# Patient Record
Sex: Male | Born: 1970 | Race: White | Hispanic: No | Smoking: Current every day smoker
Health system: Southern US, Community
[De-identification: ages and names within clinical notes are randomized; demographics above are authoritative.]

## PROBLEM LIST (undated history)

## (undated) DIAGNOSIS — T4145XA Adverse effect of unspecified anesthetic, initial encounter: Secondary | ICD-10-CM

## (undated) DIAGNOSIS — G8929 Other chronic pain: Secondary | ICD-10-CM

## (undated) DIAGNOSIS — R519 Headache, unspecified: Secondary | ICD-10-CM

## (undated) DIAGNOSIS — M549 Dorsalgia, unspecified: Secondary | ICD-10-CM

## (undated) DIAGNOSIS — R51 Headache: Secondary | ICD-10-CM

## (undated) DIAGNOSIS — T8859XA Other complications of anesthesia, initial encounter: Secondary | ICD-10-CM

## (undated) DIAGNOSIS — K219 Gastro-esophageal reflux disease without esophagitis: Secondary | ICD-10-CM

## (undated) HISTORY — PX: BACK SURGERY: SHX140

## (undated) HISTORY — PX: OTHER SURGICAL HISTORY: SHX169

---

## 2003-06-28 ENCOUNTER — Observation Stay (HOSPITAL_COMMUNITY): Admission: RE | Admit: 2003-06-28 | Discharge: 2003-06-29 | Payer: Self-pay | Admitting: Orthopedic Surgery

## 2013-08-03 ENCOUNTER — Encounter (HOSPITAL_BASED_OUTPATIENT_CLINIC_OR_DEPARTMENT_OTHER): Payer: Self-pay | Admitting: Emergency Medicine

## 2013-08-03 ENCOUNTER — Emergency Department (HOSPITAL_BASED_OUTPATIENT_CLINIC_OR_DEPARTMENT_OTHER)
Admission: EM | Admit: 2013-08-03 | Discharge: 2013-08-03 | Disposition: A | Discharge status: Home / Self Care | Payer: Self-pay | Attending: Emergency Medicine | Admitting: Emergency Medicine

## 2013-08-03 DIAGNOSIS — G8929 Other chronic pain: Secondary | ICD-10-CM | POA: Insufficient documentation

## 2013-08-03 DIAGNOSIS — Z79899 Other long term (current) drug therapy: Secondary | ICD-10-CM | POA: Insufficient documentation

## 2013-08-03 DIAGNOSIS — F17200 Nicotine dependence, unspecified, uncomplicated: Secondary | ICD-10-CM | POA: Insufficient documentation

## 2013-08-03 DIAGNOSIS — Z791 Long term (current) use of non-steroidal anti-inflammatories (NSAID): Secondary | ICD-10-CM | POA: Insufficient documentation

## 2013-08-03 DIAGNOSIS — K5289 Other specified noninfective gastroenteritis and colitis: Secondary | ICD-10-CM | POA: Insufficient documentation

## 2013-08-03 DIAGNOSIS — K529 Noninfective gastroenteritis and colitis, unspecified: Secondary | ICD-10-CM

## 2013-08-03 HISTORY — DX: Dorsalgia, unspecified: M54.9

## 2013-08-03 HISTORY — DX: Other chronic pain: G89.29

## 2013-08-03 MED ORDER — PROMETHAZINE HCL 25 MG PO TABS: 25.0000 mg | ORAL_TABLET | Freq: Four times a day (QID) | ORAL | Status: AC | PRN

## 2013-08-03 MED ORDER — NAPROXEN 500 MG PO TABS: 500.0000 mg | ORAL_TABLET | Freq: Two times a day (BID) | ORAL | Status: DC

## 2013-08-03 MED ORDER — NAPROXEN 250 MG PO TABS
500.0000 mg | ORAL_TABLET | Freq: Once | ORAL | Status: AC
  Administered 2013-08-03: 500 mg via ORAL
  Filled 2013-08-03: qty 2

## 2013-08-03 MED ORDER — ONDANSETRON 4 MG PO TBDP
4.0000 mg | ORAL_TABLET | Freq: Once | ORAL | Status: AC
  Administered 2013-08-03: 4 mg via ORAL
  Filled 2013-08-03: qty 1

## 2013-08-03 NOTE — ED Provider Notes (Signed)
CSN: [id number]     Arrival date & time 08/03/13  [id number] History   First MD Initiated Contact with Patient 08/03/13 [id number]     Chief Complaint  Patient presents with  . Emesis     (Consider location/radiation/quality/duration/timing/severity/associated sxs/prior Treatment) Patient is a 43 y.o. male presenting with vomiting. The history is provided by the patient.  Emesis Associated symptoms: diarrhea and myalgias   Associated symptoms: no abdominal pain and no headaches    patient with onset of nausea vomiting and diarrhea illness on Tuesday. Has had no diarrhea now for 2 days. No vomiting now for the last 24 hours. Nausea persists bodyaches persist. Patient has been out of work. No abnormal pain no fevers no severe headache.  Past Medical History  Diagnosis Date  . Chronic back pain    Past Surgical History  Procedure Laterality Date  . Back surgery     No family history on file. History  Substance Use Topics  . Smoking status: Current Every Day Smoker -- 1.00 packs/day    Types: Cigarettes  . Smokeless tobacco: Not on file  . Alcohol Use: No    Review of Systems  Constitutional: Positive for fatigue. Negative for fever.  HENT: Negative for congestion.   Eyes: Negative for redness.  Respiratory: Negative for cough and shortness of breath.   Cardiovascular: Negative for chest pain.  Gastrointestinal: Positive for nausea, vomiting and diarrhea. Negative for abdominal pain.  Genitourinary: Negative for hematuria.  Musculoskeletal: Positive for myalgias.  Skin: Negative for rash.  Neurological: Negative for headaches.  Hematological: Does not bruise/bleed easily.  Psychiatric/Behavioral: Negative for confusion.      Allergies  Review of patient's allergies indicates no known allergies.  Home Medications   Prior to Admission medications   Medication Sig Start Date End Date Taking? Authorizing Provider  celecoxib (CELEBREX) 200 MG capsule Take 200 mg by mouth 2 (two)  times daily.   Yes Historical Provider, MD  pregabalin (LYRICA) 75 MG capsule Take 75 mg by mouth 2 (two) times daily.   Yes Historical Provider, MD  naproxen (NAPROSYN) 500 MG tablet Take 1 tablet (500 mg total) by mouth 2 (two) times daily. 08/03/13   Shelda JakesScott W. Lilleigh Hechavarria, MD  promethazine (PHENERGAN) 25 MG tablet Take 1 tablet (25 mg total) by mouth every 6 (six) hours as needed for nausea or vomiting. 08/03/13   Shelda JakesScott W. Joua Bake, MD   BP 139/94  Pulse 82  Temp(Src) 97.5 F (36.4 C) (Oral)  Resp 14  SpO2 100% Physical Exam  Nursing note and vitals reviewed. Constitutional: He is oriented to person, place, and time. He appears well-developed and well-nourished. No distress.  HENT:  Head: Normocephalic and atraumatic.  Mouth/Throat: Oropharynx is clear and moist.  Eyes: Conjunctivae and EOM are normal. Pupils are equal, round, and reactive to light.  Neck: Normal range of motion.  Cardiovascular: Normal rate, regular rhythm and normal heart sounds.   Pulmonary/Chest: Effort normal and breath sounds normal. No respiratory distress.  Abdominal: Soft. Bowel sounds are normal. There is no tenderness.  Musculoskeletal: Normal range of motion. He exhibits no edema.  Neurological: He is alert and oriented to person, place, and time. No cranial nerve deficit. He exhibits normal muscle tone. Coordination normal.  Skin: Skin is warm. No rash noted.    ED Course  Procedures (including critical care time) Labs Review Labs Reviewed - No data to display  Imaging Review No results found.   EKG Interpretation None  MDM   Final diagnoses:  Gastroenteritis    The patient's history consistent with a gastroenteritis-type illness. No vomiting in the last several hours. No diarrhea for several days. Patient with persistent nausea. Will treat with Phenergan and Naprosyn Naprosyn as for the bodyaches. Patient nontoxic no acute distress. Work note provided.    Shelda JakesScott W. Ileta Ofarrell,  MD 08/03/13 [phone]

## 2013-08-03 NOTE — Discharge Instructions (Signed)
Diet for Diarrhea, Adult °Frequent, runny stools (diarrhea) may be caused or worsened by food or drink. Diarrhea may be relieved by changing your diet. Since diarrhea can last up to 7 days, it is easy for you to lose too much fluid from the body and become dehydrated. Fluids that are lost need to be replaced. Along with a modified diet, make sure you drink enough fluids to keep your urine clear or pale yellow. °DIET INSTRUCTIONS °· Ensure adequate fluid intake (hydration): have 1 cup (8 oz) of fluid for each diarrhea episode. Avoid fluids that contain simple sugars or sports drinks, fruit juices, whole milk products, and sodas. Your urine should be clear or pale yellow if you are drinking enough fluids. Hydrate with an oral rehydration solution that you can purchase at pharmacies, retail stores, and online. You can prepare an oral rehydration solution at home by mixing the following ingredients together: °·   tsp table salt. °· ¾ tsp baking soda. °·  tsp salt substitute containing potassium chloride. °· 1  tablespoons sugar. °· 1 L (34 oz) of water. °· Certain foods and beverages may increase the speed at which food moves through the gastrointestinal (GI) tract. These foods and beverages should be avoided and include: °· Caffeinated and alcoholic beverages. °· High-fiber foods, such as raw fruits and vegetables, nuts, seeds, and whole grain breads and cereals. °· Foods and beverages sweetened with sugar alcohols, such as xylitol, sorbitol, and mannitol. °· Some foods may be well tolerated and may help thicken stool including: °· Starchy foods, such as rice, toast, pasta, low-sugar cereal, oatmeal, grits, baked potatoes, crackers, and bagels.   °· Bananas.   °· Applesauce. °· Add probiotic-rich foods to help increase healthy bacteria in the GI tract, such as yogurt and fermented milk products. °RECOMMENDED FOODS AND BEVERAGES °Starches °Choose foods with less than 2 g of fiber per serving. °· Recommended:  Raimer,  French, and pita breads, plain rolls, buns, bagels. Plain muffins, matzo. Soda, saltine, or graham crackers. Pretzels, melba toast, zwieback. Cooked cereals made with water: cornmeal, farina, cream cereals. Dry cereals: refined corn, wheat, rice. Potatoes prepared any way without skins, refined macaroni, spaghetti, noodles, refined rice. °· Avoid:  Bread, rolls, or crackers made with whole wheat, multi-grains, rye, bran seeds, nuts, or coconut. Corn tortillas or taco shells. Cereals containing whole grains, multi-grains, bran, coconut, nuts, raisins. Cooked or dry oatmeal. Coarse wheat cereals, granola. Cereals advertised as "high-fiber." Potato skins. Whole grain pasta, wild or brown rice. Popcorn. Sweet potatoes, yams. Sweet rolls, doughnuts, waffles, pancakes, sweet breads. °Vegetables °· Recommended: Strained tomato and vegetable juices. Most well-cooked and canned vegetables without seeds. Fresh: Tender lettuce, cucumber without the skin, cabbage, spinach, bean sprouts. °· Avoid: Fresh, cooked, or canned: Artichokes, baked beans, beet greens, broccoli, Brussels sprouts, corn, kale, legumes, peas, sweet potatoes. Cooked: Green or red cabbage, spinach. Avoid large servings of any vegetables because vegetables shrink when cooked, and they contain more fiber per serving than fresh vegetables. °Fruit °· Recommended: Cooked or canned: Apricots, applesauce, cantaloupe, cherries, fruit cocktail, grapefruit, grapes, kiwi, mandarin oranges, peaches, pears, plums, watermelon. Fresh: Apples without skin, ripe banana, grapes, cantaloupe, cherries, grapefruit, peaches, oranges, plums. Keep servings limited to ½ cup or 1 piece. °· Avoid: Fresh: Apples with skin, apricots, mangoes, pears, raspberries, strawberries. Prune juice, stewed or dried prunes. Dried fruits, raisins, dates. Large servings of all fresh fruits. °Protein °· Recommended: Ground or well-cooked tender beef, ham, veal, lamb, pork, or poultry. Eggs. Fish,  oysters, shrimp,   lobster, other seafoods. Liver, organ meats.  Avoid: Tough, fibrous meats with gristle. Peanut butter, smooth or chunky. Cheese, nuts, seeds, legumes, dried peas, beans, lentils. Dairy  Recommended: Yogurt, lactose-free milk, kefir, drinkable yogurt, buttermilk, soy milk, or plain hard cheese.  Avoid: Milk, chocolate milk, beverages made with milk, such as milkshakes. Soups  Recommended: Bouillon, broth, or soups made from allowed foods. Any strained soup.  Avoid: Soups made from vegetables that are not allowed, cream or milk-based soups. Desserts and Sweets  Recommended: Sugar-free gelatin, sugar-free frozen ice pops made without sugar alcohol.  Avoid: Plain cakes and cookies, pie made with fruit, pudding, custard, cream pie. Gelatin, fruit, ice, sherbet, frozen ice pops. Ice cream, ice milk without nuts. Plain hard candy, honey, jelly, molasses, syrup, sugar, chocolate syrup, gumdrops, marshmallows. Fats and Oils  Recommended: Limit fats to less than 8 tsp per day.  Avoid: Seeds, nuts, olives, avocados. Margarine, butter, cream, mayonnaise, salad oils, plain salad dressings. Plain gravy, crisp bacon without rind. Beverages  Recommended: Water, decaffeinated teas, oral rehydration solutions, sugar-free beverages not sweetened with sugar alcohols.  Avoid: Fruit juices, caffeinated beverages (coffee, tea, soda), alcohol, sports drinks, or lemon-lime soda. Condiments  Recommended: Ketchup, mustard, horseradish, vinegar, cocoa powder. Spices in moderation: allspice, basil, bay leaves, celery powder or leaves, cinnamon, cumin powder, curry powder, ginger, mace, marjoram, onion or garlic powder, oregano, paprika, parsley flakes, ground pepper, rosemary, sage, savory, tarragon, thyme, turmeric.  Avoid: Coconut, honey. Document Released: 06/19/2003 Document Revised: 12/22/2011 Document Reviewed: 08/13/2011 [facility] Patient Information 2014 [employer], [place].  Take the  Phenergan as needed for the nausea any vomiting. Take the Naprosyn as needed for the bodyaches. Return for any newer worse symptoms. Work note provided.

## 2013-08-03 NOTE — ED Notes (Signed)
Pt has had n/v and diarrhea since Tuesday night. Pt took phenergan with some relief.  Pt continues to have body aches and nausea today.  No GU symtoms, no cough or cold.  Pt states that he feels tired and weak.  Pt has not vomited for 24 hours

## 2013-11-14 ENCOUNTER — Emergency Department (HOSPITAL_BASED_OUTPATIENT_CLINIC_OR_DEPARTMENT_OTHER): Payer: Self-pay

## 2013-11-14 ENCOUNTER — Other Ambulatory Visit: Payer: Self-pay

## 2013-11-14 ENCOUNTER — Encounter (HOSPITAL_BASED_OUTPATIENT_CLINIC_OR_DEPARTMENT_OTHER): Payer: Self-pay | Admitting: Emergency Medicine

## 2013-11-14 ENCOUNTER — Emergency Department (HOSPITAL_BASED_OUTPATIENT_CLINIC_OR_DEPARTMENT_OTHER)
Admission: EM | Admit: 2013-11-14 | Discharge: 2013-11-14 | Disposition: A | Discharge status: Home / Self Care | Payer: Self-pay | Attending: Emergency Medicine | Admitting: Emergency Medicine

## 2013-11-14 DIAGNOSIS — F17200 Nicotine dependence, unspecified, uncomplicated: Secondary | ICD-10-CM | POA: Insufficient documentation

## 2013-11-14 DIAGNOSIS — Z79899 Other long term (current) drug therapy: Secondary | ICD-10-CM | POA: Insufficient documentation

## 2013-11-14 DIAGNOSIS — R079 Chest pain, unspecified: Secondary | ICD-10-CM | POA: Insufficient documentation

## 2013-11-14 DIAGNOSIS — Z791 Long term (current) use of non-steroidal anti-inflammatories (NSAID): Secondary | ICD-10-CM | POA: Insufficient documentation

## 2013-11-14 DIAGNOSIS — R1013 Epigastric pain: Secondary | ICD-10-CM | POA: Insufficient documentation

## 2013-11-14 DIAGNOSIS — R112 Nausea with vomiting, unspecified: Secondary | ICD-10-CM | POA: Insufficient documentation

## 2013-11-14 DIAGNOSIS — G8929 Other chronic pain: Secondary | ICD-10-CM | POA: Insufficient documentation

## 2013-11-14 LAB — CBC WITH DIFFERENTIAL/PLATELET
Basophils Absolute: 0 10*3/uL (ref 0.0–0.1)
Basophils Relative: 0 % (ref 0–1)
EOS ABS: 0.1 10*3/uL (ref 0.0–0.7)
EOS PCT: 1 % (ref 0–5)
HEMATOCRIT: 45.5 % (ref 39.0–52.0)
Hemoglobin: 15.4 g/dL (ref 13.0–17.0)
LYMPHS ABS: 1.3 10*3/uL (ref 0.7–4.0)
LYMPHS PCT: 10 % — AB (ref 12–46)
MCH: 29.3 pg (ref 26.0–34.0)
MCHC: 33.8 g/dL (ref 30.0–36.0)
MCV: 86.7 fL (ref 78.0–100.0)
MONO ABS: 0.5 10*3/uL (ref 0.1–1.0)
Monocytes Relative: 4 % (ref 3–12)
Neutro Abs: 11.5 10*3/uL — ABNORMAL HIGH (ref 1.7–7.7)
Neutrophils Relative %: 86 % — ABNORMAL HIGH (ref 43–77)
Platelets: 249 10*3/uL (ref 150–400)
RBC: 5.25 MIL/uL (ref 4.22–5.81)
RDW: 14.1 % (ref 11.5–15.5)
WBC: 13.4 10*3/uL — AB (ref 4.0–10.5)

## 2013-11-14 LAB — COMPREHENSIVE METABOLIC PANEL
ALT: 19 U/L (ref 0–53)
ANION GAP: 12 (ref 5–15)
AST: 14 U/L (ref 0–37)
Albumin: 4.1 g/dL (ref 3.5–5.2)
Alkaline Phosphatase: 80 U/L (ref 39–117)
BUN: 11 mg/dL (ref 6–23)
CALCIUM: 10.3 mg/dL (ref 8.4–10.5)
CO2: 26 meq/L (ref 19–32)
CREATININE: 0.7 mg/dL (ref 0.50–1.35)
Chloride: 101 mEq/L (ref 96–112)
GLUCOSE: 136 mg/dL — AB (ref 70–99)
Potassium: 4.5 mEq/L (ref 3.7–5.3)
Sodium: 139 mEq/L (ref 137–147)
TOTAL PROTEIN: 7.2 g/dL (ref 6.0–8.3)
Total Bilirubin: 0.5 mg/dL (ref 0.3–1.2)

## 2013-11-14 LAB — LIPASE, BLOOD: LIPASE: 33 U/L (ref 11–59)

## 2013-11-14 LAB — TROPONIN I: Troponin I: <0.3 ng/mL (ref <0.30)

## 2013-11-14 MED ORDER — PANTOPRAZOLE SODIUM 40 MG IV SOLR
40.0000 mg | Freq: Once | INTRAVENOUS | Status: AC
  Administered 2013-11-14: 40 mg via INTRAVENOUS
  Filled 2013-11-14: qty 40

## 2013-11-14 MED ORDER — ESOMEPRAZOLE MAGNESIUM 40 MG PO CPDR: 40.0000 mg | DELAYED_RELEASE_CAPSULE | Freq: Every day | ORAL | Status: AC

## 2013-11-14 NOTE — ED Provider Notes (Addendum)
CSN: [id number]     Arrival date & time 11/14/13  1332 History   First MD Initiated Contact with Patient 11/14/13 1343     Chief Complaint  Patient presents with  . Chest Pain     (Consider location/radiation/quality/duration/timing/severity/associated sxs/prior Treatment) HPI Comments: Patient presents with chest pain. He describes it as achy pressure feeling in his upper abdomen is through to his back. He states it started about 2 hours ago it has been constant but improving since that time. He states he first started having some nausea early this morning had one episode of vomiting and then subsequently the pain started. He has a little bit of nausea currently. He denies any shortness of breath. He denies any pleuritic-type pain. He does say that he got diaphoretic when the pain was severe. He did not feel it's related to eating. He denies eating this morning. He denies any recent alcohol use. He denies a history of heart problems. He states his father had bypass surgery in his late 7250s or early 8160s. He does not have a known history of hypertension or hyperlipidemia but has not seen a doctor in a while. He is current everyday smoker.  Patient is a 43 y.o. male presenting with chest pain.  Chest Pain Associated symptoms: nausea and vomiting   Associated symptoms: no abdominal pain, no back pain, no cough, no diaphoresis, no dizziness, no fatigue, no fever, no headache, no numbness, no shortness of breath and no weakness     Past Medical History  Diagnosis Date  . Chronic back pain    Past Surgical History  Procedure Laterality Date  . Back surgery     History reviewed. No pertinent family history. History  Substance Use Topics  . Smoking status: Current Every Day Smoker -- 1.00 packs/day    Types: Cigarettes  . Smokeless tobacco: Not on file  . Alcohol Use: No    Review of Systems  Constitutional: Negative for fever, chills, diaphoresis and fatigue.  HENT: Negative for  congestion, rhinorrhea and sneezing.   Eyes: Negative.   Respiratory: Negative for cough, chest tightness and shortness of breath.   Cardiovascular: Positive for chest pain. Negative for leg swelling.  Gastrointestinal: Positive for nausea and vomiting. Negative for abdominal pain, diarrhea and blood in stool.  Genitourinary: Negative for frequency, hematuria, flank pain and difficulty urinating.  Musculoskeletal: Negative for arthralgias and back pain.  Skin: Negative for rash.  Neurological: Negative for dizziness, speech difficulty, weakness, numbness and headaches.      Allergies  Review of patient's allergies indicates no known allergies.  Home Medications   Prior to Admission medications   Medication Sig Start Date End Date Taking? Authorizing Provider  celecoxib (CELEBREX) 200 MG capsule Take 200 mg by mouth 2 (two) times daily.    Historical Provider, MD  esomeprazole (NEXIUM) 40 MG capsule Take 1 capsule (40 mg total) by mouth daily. 11/14/13   Rolan BuccoMelanie Gwendlyon Zumbro, MD  naproxen (NAPROSYN) 500 MG tablet Take 1 tablet (500 mg total) by mouth 2 (two) times daily. 08/03/13   Vanetta MuldersScott Zackowski, MD  pregabalin (LYRICA) 75 MG capsule Take 75 mg by mouth 2 (two) times daily.    Historical Provider, MD  promethazine (PHENERGAN) 25 MG tablet Take 1 tablet (25 mg total) by mouth every 6 (six) hours as needed for nausea or vomiting. 08/03/13   Vanetta MuldersScott Zackowski, MD   BP 125/64  Pulse 73  Temp(Src) 98.2 F (36.8 C) (Oral)  Resp 18  Ht 6'  3" (1.905 m)  Wt 198 lb (89.812 kg)  BMI 24.75 kg/m2  SpO2 96% Physical Exam  Constitutional: He is oriented to person, place, and time. He appears well-developed and well-nourished.  HENT:  Head: Normocephalic and atraumatic.  Eyes: Pupils are equal, round, and reactive to light.  Neck: Normal range of motion. Neck supple.  Cardiovascular: Normal rate, regular rhythm and normal heart sounds.   Pulmonary/Chest: Effort normal and breath sounds normal. No  respiratory distress. He has no wheezes. He has no rales. He exhibits no tenderness.  Abdominal: Soft. Bowel sounds are normal. There is tenderness (mild TTP epigastrium). There is no rebound and no guarding.  Musculoskeletal: Normal range of motion. He exhibits no edema.  Lymphadenopathy:    He has no cervical adenopathy.  Neurological: He is alert and oriented to person, place, and time.  Skin: Skin is warm and dry. No rash noted.  Psychiatric: He has a normal mood and affect.    ED Course  Procedures (including critical care time) Labs Review Results for orders placed during the hospital encounter of 11/14/13  TROPONIN I      Result Value Ref Range   Troponin I <0.30  <0.30 ng/mL  CBC WITH DIFFERENTIAL      Result Value Ref Range   WBC 13.4 (*) 4.0 - 10.5 K/uL   RBC 5.25  4.22 - 5.81 MIL/uL   Hemoglobin 15.4  13.0 - 17.0 g/dL   HCT 40.9  81.1 - 91.4 %   MCV 86.7  78.0 - 100.0 fL   MCH 29.3  26.0 - 34.0 pg   MCHC 33.8  30.0 - 36.0 g/dL   RDW 78.2  95.6 - 21.3 %   Platelets 249  150 - 400 K/uL   Neutrophils Relative % 86 (*) 43 - 77 %   Neutro Abs 11.5 (*) 1.7 - 7.7 K/uL   Lymphocytes Relative 10 (*) 12 - 46 %   Lymphs Abs 1.3  0.7 - 4.0 K/uL   Monocytes Relative 4  3 - 12 %   Monocytes Absolute 0.5  0.1 - 1.0 K/uL   Eosinophils Relative 1  0 - 5 %   Eosinophils Absolute 0.1  0.0 - 0.7 K/uL   Basophils Relative 0  0 - 1 %   Basophils Absolute 0.0  0.0 - 0.1 K/uL  COMPREHENSIVE METABOLIC PANEL      Result Value Ref Range   Sodium 139  137 - 147 mEq/L   Potassium 4.5  3.7 - 5.3 mEq/L   Chloride 101  96 - 112 mEq/L   CO2 26  19 - 32 mEq/L   Glucose, Bld 136 (*) 70 - 99 mg/dL   BUN 11  6 - 23 mg/dL   Creatinine, Ser 0.86  0.50 - 1.35 mg/dL   Calcium 57.8  8.4 - 46.9 mg/dL   Total Protein 7.2  6.0 - 8.3 g/dL   Albumin 4.1  3.5 - 5.2 g/dL   AST 14  0 - 37 U/L   ALT 19  0 - 53 U/L   Alkaline Phosphatase 80  39 - 117 U/L   Total Bilirubin 0.5  0.3 - 1.2 mg/dL   GFR calc  non Af Amer >90  >90 mL/min   GFR calc Af Amer >90  >90 mL/min   Anion gap 12  5 - 15  LIPASE, BLOOD      Result Value Ref Range   Lipase 33  11 - 59 U/L  Dg Chest 2 View  11/14/2013   CLINICAL DATA:  Chest pain.  EXAM: CHEST  2 VIEW  COMPARISON:  06/28/2003.  FINDINGS: Mediastinal and hilar structures are normal. The lungs are clear. Heart size normal. No pleural effusion or pneumothorax.  IMPRESSION: No acute cardiopulmonary disease.  Chest is stable from prior exam .   Electronically Signed   By: Maisie Fus  Register   On: 11/14/2013 14:03      Imaging Review Dg Chest 2 View  11/14/2013   CLINICAL DATA:  Chest pain.  EXAM: CHEST  2 VIEW  COMPARISON:  06/28/2003.  FINDINGS: Mediastinal and hilar structures are normal. The lungs are clear. Heart size normal. No pleural effusion or pneumothorax.  IMPRESSION: No acute cardiopulmonary disease.  Chest is stable from prior exam .   Electronically Signed   By: Maisie Fus  Register   On: 11/14/2013 14:03     EKG Interpretation   Date/Time:  Wednesday November 14 2013 13:42:59 EDT Ventricular Rate:  78 PR Interval:  168 QRS Duration: 86 QT Interval:  390 QTC Calculation: 444 R Axis:   74 Text Interpretation:  Normal sinus rhythm with sinus arrhythmia Normal ECG  No old tracing to compare Confirmed by Caryle Helgeson  MD, Maralyn Witherell ([id number]) on  11/14/2013 3:18:14 PM      MDM   Final diagnoses:  Chest pain, unspecified chest pain type    Patient is given Protonix in ED. He's pain free currently. His EKG did not show ischemic changes. His first troponin is negative. He has a heart score of 1. The plan is to check a second troponin at 5 PM today and if this is negative he can be discharged home with followup with his primary care physician who is with [facility]. Encouraged him to followup with his physician within the next 2 days or return here if his symptoms worsen.  Dr Manus Gunning will f/u on 2nd troponin and discharge as appropriate.  Repeat EKG   Date: 11/14/2013  Rate: 56  Rhythm: sinus bradycardia  QRS Axis: normal  Intervals: normal  ST/T Wave abnormalities: normal  Conduction Disutrbances:none  Narrative Interpretation:   Old EKG Reviewed: unchanged    Rolan Bucco, MD 11/14/13 1520  Rolan Bucco, MD 11/14/13 1540

## 2013-11-14 NOTE — ED Provider Notes (Signed)
Second troponin negative. Stable for discharge per plan established by Dr. Fredderick PhenixBelfi.  BP 125/64  Pulse 73  Temp(Src) 98.2 F (36.8 C) (Oral)  Resp 18  Ht 6\' 3"  (1.905 m)  Wt 198 lb (89.812 kg)  BMI 24.75 kg/m2  SpO2 96%   Aaron OctaveStephen Ladarrian Asencio, Aaron Livingston 11/14/13 [phone]

## 2013-11-14 NOTE — ED Notes (Signed)
Pt c/o med chest pain described as pressure which radiates to back x 2 hrs

## 2013-11-14 NOTE — Discharge Instructions (Signed)

## 2013-11-14 NOTE — ED Notes (Signed)
MD at bedside. 

## 2013-11-16 ENCOUNTER — Emergency Department (HOSPITAL_COMMUNITY): Payer: Self-pay

## 2013-11-16 ENCOUNTER — Emergency Department (HOSPITAL_COMMUNITY)
Admission: EM | Admit: 2013-11-16 | Discharge: 2013-11-17 | Disposition: A | Discharge status: Home / Self Care | Payer: Self-pay | Attending: Emergency Medicine | Admitting: Emergency Medicine

## 2013-11-16 ENCOUNTER — Encounter (HOSPITAL_COMMUNITY): Payer: Self-pay | Admitting: Emergency Medicine

## 2013-11-16 DIAGNOSIS — R1013 Epigastric pain: Secondary | ICD-10-CM | POA: Insufficient documentation

## 2013-11-16 DIAGNOSIS — G8929 Other chronic pain: Secondary | ICD-10-CM | POA: Insufficient documentation

## 2013-11-16 DIAGNOSIS — Z79899 Other long term (current) drug therapy: Secondary | ICD-10-CM | POA: Insufficient documentation

## 2013-11-16 DIAGNOSIS — Z791 Long term (current) use of non-steroidal anti-inflammatories (NSAID): Secondary | ICD-10-CM | POA: Insufficient documentation

## 2013-11-16 DIAGNOSIS — F17200 Nicotine dependence, unspecified, uncomplicated: Secondary | ICD-10-CM | POA: Insufficient documentation

## 2013-11-16 DIAGNOSIS — D72829 Elevated white blood cell count, unspecified: Secondary | ICD-10-CM | POA: Insufficient documentation

## 2013-11-16 LAB — COMPREHENSIVE METABOLIC PANEL
ALBUMIN: 4.7 g/dL (ref 3.5–5.2)
ALT: 21 U/L (ref 0–53)
AST: 20 U/L (ref 0–37)
Alkaline Phosphatase: 91 U/L (ref 39–117)
Anion gap: 20 — ABNORMAL HIGH (ref 5–15)
BILIRUBIN TOTAL: 1 mg/dL (ref 0.3–1.2)
BUN: 14 mg/dL (ref 6–23)
CHLORIDE: 93 meq/L — AB (ref 96–112)
CO2: 24 mEq/L (ref 19–32)
CREATININE: 0.7 mg/dL (ref 0.50–1.35)
Calcium: 10.5 mg/dL (ref 8.4–10.5)
GFR calc Af Amer: >90 mL/min (ref >90)
GFR calc non Af Amer: >90 mL/min (ref >90)
Glucose, Bld: 111 mg/dL — ABNORMAL HIGH (ref 70–99)
POTASSIUM: 3.8 meq/L (ref 3.7–5.3)
SODIUM: 137 meq/L (ref 137–147)
Total Protein: 8.1 g/dL (ref 6.0–8.3)

## 2013-11-16 LAB — CBC WITH DIFFERENTIAL/PLATELET
BASOS ABS: 0 10*3/uL (ref 0.0–0.1)
BASOS PCT: 0 % (ref 0–1)
Eosinophils Absolute: 0 10*3/uL (ref 0.0–0.7)
Eosinophils Relative: 0 % (ref 0–5)
HCT: 48.6 % (ref 39.0–52.0)
Hemoglobin: 17.1 g/dL — ABNORMAL HIGH (ref 13.0–17.0)
Lymphocytes Relative: 13 % (ref 12–46)
Lymphs Abs: 2.2 10*3/uL (ref 0.7–4.0)
MCH: 29.6 pg (ref 26.0–34.0)
MCHC: 35.2 g/dL (ref 30.0–36.0)
MCV: 84.1 fL (ref 78.0–100.0)
MONO ABS: 1.3 10*3/uL — AB (ref 0.1–1.0)
Monocytes Relative: 7 % (ref 3–12)
NEUTROS ABS: 13.9 10*3/uL — AB (ref 1.7–7.7)
NEUTROS PCT: 80 % — AB (ref 43–77)
PLATELETS: 299 10*3/uL (ref 150–400)
RBC: 5.78 MIL/uL (ref 4.22–5.81)
RDW: 13.8 % (ref 11.5–15.5)
WBC: 17.5 10*3/uL — ABNORMAL HIGH (ref 4.0–10.5)

## 2013-11-16 LAB — LIPASE, BLOOD: LIPASE: 26 U/L (ref 11–59)

## 2013-11-16 LAB — I-STAT TROPONIN, ED: Troponin i, poc: 0 ng/mL (ref 0.00–0.08)

## 2013-11-16 MED ORDER — ONDANSETRON HCL 4 MG/2ML IJ SOLN
4.0000 mg | Freq: Once | INTRAMUSCULAR | Status: AC
  Administered 2013-11-16: 4 mg via INTRAVENOUS
  Filled 2013-11-16: qty 2

## 2013-11-16 MED ORDER — METOCLOPRAMIDE HCL 5 MG/ML IJ SOLN
10.0000 mg | Freq: Once | INTRAMUSCULAR | Status: AC
  Administered 2013-11-16: 10 mg via INTRAVENOUS
  Filled 2013-11-16: qty 2

## 2013-11-16 MED ORDER — SODIUM CHLORIDE 0.9 % IV SOLN: 1000.0000 mL | INTRAVENOUS | Status: DC

## 2013-11-16 MED ORDER — IOHEXOL 300 MG/ML  SOLN
100.0000 mL | Freq: Once | INTRAMUSCULAR | Status: AC | PRN
Start: 2013-11-16 — End: 2013-11-16
  Administered 2013-11-16: 100 mL via INTRAVENOUS

## 2013-11-16 MED ORDER — SODIUM CHLORIDE 0.9 % IV SOLN
1000.0000 mL | Freq: Once | INTRAVENOUS | Status: AC
  Administered 2013-11-16: 1000 mL via INTRAVENOUS

## 2013-11-16 MED ORDER — HYDROMORPHONE HCL PF 1 MG/ML IJ SOLN
1.0000 mg | Freq: Once | INTRAMUSCULAR | Status: AC
  Administered 2013-11-16: 1 mg via INTRAVENOUS
  Filled 2013-11-16: qty 1

## 2013-11-16 MED ORDER — ACETAMINOPHEN 325 MG PO TABS
650.0000 mg | ORAL_TABLET | Freq: Four times a day (QID) | ORAL | Status: DC | PRN
  Administered 2013-11-16: 650 mg via ORAL
  Filled 2013-11-16: qty 2

## 2013-11-16 NOTE — ED Notes (Signed)
Pt presents with mid upper abd pain radiating to RUQ region,m nausea and vomiting starting  on Wednesday. Pt seen at [facility] and diagnosed with Acid Reflux pt followed up with his PCP on Thursday and prescribed phenergan with no relief. Pt also reports difficulty urinating due to not able to keep food or fluids down.

## 2013-11-16 NOTE — ED Provider Notes (Signed)
CSN: [id number]     Arrival date & time 11/16/13  1618 History   First MD Initiated Contact with Patient 11/16/13 1756     Chief Complaint  Patient presents with  . Abdominal Pain      HPI Patient presents emergency department with ongoing right upper quadrant and epigastric abdominal pain over the past 3 days.  He was seen at med center [facility] and it was thought to be gastritis.  He was discharged with pain medicine.  He states he continues to have discomfort and pain.  He reports pain seems to be worsening.  Phenergan does not improve his nausea.  His had decreased oral intake.  He presents the emergency department with a fever of 101.  His pain is moderate in severity   Past Medical History  Diagnosis Date  . Chronic back pain    Past Surgical History  Procedure Laterality Date  . Back surgery     History reviewed. No pertinent family history. History  Substance Use Topics  . Smoking status: Current Every Day Smoker -- 1.00 packs/day    Types: Cigarettes  . Smokeless tobacco: Not on file  . Alcohol Use: No    Review of Systems  All other systems reviewed and are negative.     Allergies  Review of patient's allergies indicates no known allergies.  Home Medications   Prior to Admission medications   Medication Sig Start Date End Date Taking? Authorizing Provider  celecoxib (CELEBREX) 200 MG capsule Take 200 mg by mouth 2 (two) times daily.    Historical Provider, MD  esomeprazole (NEXIUM) 40 MG capsule Take 1 capsule (40 mg total) by mouth daily. 11/14/13   Rolan Bucco, MD  naproxen (NAPROSYN) 500 MG tablet Take 1 tablet (500 mg total) by mouth 2 (two) times daily. 08/03/13   Vanetta Mulders, MD  pregabalin (LYRICA) 75 MG capsule Take 75 mg by mouth 2 (two) times daily.    Historical Provider, MD  promethazine (PHENERGAN) 25 MG tablet Take 1 tablet (25 mg total) by mouth every 6 (six) hours as needed for nausea or vomiting. 08/03/13   Vanetta Mulders, MD   BP 123/70   Pulse 56  Temp(Src) 101.1 F (38.4 C) (Rectal)  Resp 20  SpO2 100% Physical Exam  Nursing note and vitals reviewed. Constitutional: He is oriented to person, place, and time. He appears well-developed and well-nourished.  HENT:  Head: Normocephalic and atraumatic.  Eyes: EOM are normal.  Neck: Normal range of motion.  Cardiovascular: Normal rate, regular rhythm, normal heart sounds and intact distal pulses.   Pulmonary/Chest: Effort normal and breath sounds normal. No respiratory distress.  Abdominal: Soft. He exhibits no distension.  Epigastric tenderness without guarding or rebound  Musculoskeletal: Normal range of motion.  Neurological: He is alert and oriented to person, place, and time.  Skin: Skin is warm and dry.  Psychiatric: He has a normal mood and affect. Judgment normal.    ED Course  Procedures (including critical care time) Labs Review Labs Reviewed  CBC WITH DIFFERENTIAL - Abnormal; Notable for the following:    WBC 17.5 (*)    Hemoglobin 17.1 (*)    Neutrophils Relative % 80 (*)    Neutro Abs 13.9 (*)    Monocytes Absolute 1.3 (*)    All other components within normal limits  COMPREHENSIVE METABOLIC PANEL - Abnormal; Notable for the following:    Chloride 93 (*)    Glucose, Bld 111 (*)    Anion  gap 20 (*)    All other components within normal limits  LIPASE, BLOOD  I-STAT TROPOININ, ED    Imaging Review No results found.   EKG Interpretation   Date/Time:  Friday November 16 2013 16:55:05 EDT Ventricular Rate:  81 PR Interval:  144 QRS Duration: 86 QT Interval:  362 QTC Calculation: 420 R Axis:   80 Text Interpretation:  Normal sinus rhythm Normal ECG No significant change  was found Confirmed by Jilian West  MD, Annalese Stiner ([id number]) on 11/16/2013 5:58:25 PM      MDM   Final diagnoses:  None    Concerning for cholecystitis.  Ultrasound pending.  White count 17,000.  His gallbladder demonstrates no signs of infection the patient will need a CT scan of  his abdomen and pelvis.  N.p.o. Imaging to be followed up by my NP Volney AmericanGail Shulz    Ethal Gotay M Rodgers Likes, MD 11/17/13 [phone]

## 2013-11-17 MED ORDER — SUCRALFATE 1 G PO TABS
1.0000 g | ORAL_TABLET | Freq: Once | ORAL | Status: AC
  Administered 2013-11-17: 1 g via ORAL
  Filled 2013-11-17: qty 1

## 2013-11-17 MED ORDER — SUCRALFATE 1 GM/10ML PO SUSP: 1.0000 g | Freq: Three times a day (TID) | ORAL | Status: DC

## 2013-11-17 NOTE — ED Notes (Signed)
Discharge instructions reviewed with pt. Pt verbalized understanding.   

## 2013-11-17 NOTE — Discharge Instructions (Signed)
Leukocytosis °Leukocytosis means you have more white blood cells than normal. White blood cells are made in your bone marrow. The main job of white blood cells is to fight infection. Having too many white blood cells is a common condition. It can develop as a result of many types of medical problems. °CAUSES  °In some cases, your bone marrow may be normal, but it is still making too many white blood cells. This could be the result of: °· Infection. °· Injury. °· Physical stress. °· Emotional stress. °· Surgery. °· Allergic reactions. °· Tumors that do not start in the blood or bone marrow. °· An inherited disease. °· Certain medicines. °· Pregnancy and labor. °In other cases, you may have a bone marrow disorder that is causing your body to make too many white blood cells. Bone marrow disorders include: °· Leukemia. This is a type of blood cancer. °· Myeloproliferative disorders. These disorders cause blood cells to grow abnormally. °SYMPTOMS  °Some people have no symptoms. Others have symptoms due to the medical problem that is causing their leukocytosis. These symptoms may include: °· Bleeding. °· Bruising. °· Fever. °· Night sweats. °· Repeated infections. °· Weakness. °· Weight loss. °DIAGNOSIS  °Leukocytosis is often found during blood tests that are done as part of a normal physical exam. Your caregiver will probably order other tests to help determine why you have too many white blood cells. These tests may include: °· A complete blood count (CBC). This test measures all the types of blood cells in your body. °· Chest X-rays, urine tests (urinalysis), or other tests to look for signs of infection. °· Bone marrow aspiration. For this test, a needle is put into your bone. Cells from the bone marrow are removed through the needle. The cells are then examined under a microscope. °TREATMENT  °Treatment is usually not needed for leukocytosis. However, if a disorder is causing your leukocytosis, it will need to be  treated. Treatment may include: °· Antibiotic medicines if you have a bacterial infection. °· Bone marrow transplant. Your diseased bone marrow is replaced with healthy cells that will grow new bone marrow. °· Chemotherapy. This is the use of drugs to kill cancer cells. °HOME CARE INSTRUCTIONS °· Only take over-the-counter or prescription medicines as directed by your caregiver. °· Maintain a healthy weight. Ask your caregiver what weight is best for you. °· Eat foods that are low in saturated fats and high in fiber. Eat plenty of fruits and vegetables. °· Drink enough fluids to keep your urine clear or pale yellow. °· Get 30 minutes of exercise at least 5 times a week. Check with your caregiver before starting a new exercise routine. °· Limit caffeine and alcohol. °· Do not smoke. °· Keep all follow-up appointments as directed by your caregiver. °SEEK MEDICAL CARE IF: °· You feel weak or more tired than usual. °· You develop chills, a cough, or nasal congestion. °· You lose weight without trying. °· You have night sweats. °· You bruise easily. °SEEK IMMEDIATE MEDICAL CARE IF: °· You bleed more than normal. °· You have chest pain. °· You have trouble breathing. °· You have a fever. °· You have uncontrolled nausea or vomiting. °· You feel dizzy or lightheaded. °MAKE SURE YOU: °· Understand these instructions. °· Will watch your condition. °· Will get help right away if you are not doing well or get worse. °Document Released: 03/18/2011 Document Revised: 06/21/2011 Document Reviewed: 03/18/2011 °[employer]® Patient Information ©2015 [employer]. This information is   not intended to replace advice given to you by your health care provider. Make sure you discuss any questions you have with your health care provider. Tonight your ultrasound and CT Scan are normal You are being referred to a gastroenterologist for further evaluation  You have been given a prescriptions for additional medication to take until the Nexium  has had a chance to become effective

## 2017-03-18 ENCOUNTER — Ambulatory Visit: Payer: Self-pay | Admitting: Orthopedic Surgery

## 2017-03-25 ENCOUNTER — Ambulatory Visit: Payer: Self-pay | Admitting: Orthopedic Surgery

## 2017-03-25 NOTE — H&P (View-Only) (Signed)
Aaron Livingston is an 46 y.o. male.   Chief Complaint: back and left leg pain HPI: The patient is a 46 year old male who presents today for follow up of their back. The patient is being followed for their low back pain and back symptoms. They are now 11 week(s) out from injury (DOI 12/09/2016). Symptoms reported today include: pain. Current treatment includes: activity modification. The following medication has been used for pain control: none. The patient presents today following ESI L4-5 x 15 days. The patient reports the injection did not help much. Note for "Follow-up back": The patient currently has light duty restrictions of no lifting over 10 lbs, bending, stooping, squatting and no prolonged standing or sitting.  Note:Patient reports injection gave him minimal relief. He is 11 weeks status post his injury is pain that radiates down into the left leg now specifically not the right. 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  Past Medical History:  Diagnosis Date  . Chronic back pain     Past Surgical History:  Procedure Laterality Date  . BACK SURGERY      No family history on file. Social History:  reports that he has been smoking cigarettes.  He has been smoking about 1.00 pack per day. He does not have any smokeless tobacco history on file. He reports that he does not drink alcohol or use drugs.  Allergies:  Allergies  Allergen Reactions  . Ibuprofen Other (See Comments)    Upset stomach, shaking, tremors, magnifies headache     (Not in a hospital admission)  No results found for this or any previous visit (from the past 48 hour(s)). No results found.  Review of Systems  Constitutional: Negative.   HENT: Negative.   Eyes: Negative.   Respiratory: Negative.   Cardiovascular: Negative.   Gastrointestinal: Negative.   Genitourinary: Negative.   Musculoskeletal: Positive for back pain.  Skin: Negative.   Neurological: Positive for  sensory change and focal weakness.  Psychiatric/Behavioral: Negative.     There were no vitals taken for this visit. Physical Exam  Constitutional: He is oriented to person, place, and time. He appears well-developed.  HENT:  Head: Normocephalic.  Eyes: Pupils are equal, round, and reactive to light.  Neck: Normal range of motion.  Cardiovascular: Normal rate.  Respiratory: Effort normal.  GI: Soft.  Musculoskeletal:  Moderate distress. Walks an antalgic gait. Mood and affect is appropriate is tender left proximal gluteus. Straight leg raise buttock that calf pain on the left negative on the right. EHLs 4+/5 left compared to the right. Sensory exam is intact. No Babinski or clonus. No instability hips knees and ankles. Pulses intact. Pelvis stable. Abdomen soft nontender. Upper extremities motor is 5/5. Normoreflexic. Sensory exam is intact. No Hoffmann sign.   Neurological: He is alert and oriented to person, place, and time.  Skin: Skin is warm and dry.    MRI demonstrates paracentral disc herniation at 4 5 to the left deflecting the left L5 nerve root. There is underlying disc degeneration as well. MRI clearly shows that on the series 4 image 40/64 on the T2 sagittal image as well as the axial T2 image series 5 image 10 out of 18.  Assessment/Plan This point time the patient has a received only temporary relief from the epidural steroid injection. He has a radicular pain into the left leg the L5 nerve root distribution. He is neurotension signs and EHL weakness.  He continues  to remain out of work as there is no light duty available. He reports he is unable to do so due to his left leg pain. He has minimal back pain.  2 options at this point time 1 conservative I would consider up maximum medical improvement in a radiant release versus consideration of a microlumbar decompression at L4-5 on the left. He does not want to live with his symptoms. 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  I had an extensive discussion of the risks and benefits of the lumbar decompression with the patient including bleeding, infection, damage to neurovascular structures, epidural fibrosis, CSF leak requiring repair. We also discussed increase in pain, adjacent segment disease, recurrent disc herniation, need for future surgery including repeat decompression and/or fusion. We also discussed risks of postoperative hematoma, paralysis, anesthetic complications including DVT, PE, death, cardiopulmonary dysfunction. In addition, the perioperative and postoperative courses were discussed in detail including the rehabilitative time and return to functional activity and work. I provided the patient with an illustrated handout and utilized the appropriate surgical models.  We discussed the risks and benefits including bleeding, infection damage to neurovascular structures no changes in his symptoms worsen in symptoms DVT PE anesthetic complications etc.  Overnight in the hospital  Sutures out in 2 weeks  Light duty available at 6 weeks and max medical improvement 3 months postoperatively. Is instructed to quit smoking advised him against that indicating the deleterious side effects upon healing and scar tissue.Continue light duty restriction in the interim no lifting over 10 pounds or repetitive bending stooping and squatting.  I do feel this is related to his initial injury.   BISSELL, JACLYN M., PA-C  For Dr. Beane 03/25/2017, 11:34 AM   

## 2017-03-25 NOTE — H&P (Signed)
Aaron Livingston is an 46 y.o. male.   Chief Complaint: back and left leg pain HPI: The patient is a 46 year old male who presents today for follow up of their back. The patient is being followed for their low back pain and back symptoms. They are now 11 week(s) out from injury (DOI 12/09/2016). Symptoms reported today include: pain. Current treatment includes: activity modification. The following medication has been used for pain control: none. The patient presents today following ESI L4-5 x 15 days. The patient reports the injection did not help much. Note for "Follow-up back": The patient currently has light duty restrictions of no lifting over 10 lbs, bending, stooping, squatting and no prolonged standing or sitting.  Note:Patient reports injection gave him minimal relief. He is 11 weeks status post his injury is pain that radiates down into the left leg now specifically not the right. 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  Past Medical History:  Diagnosis Date  . Chronic back pain     Past Surgical History:  Procedure Laterality Date  . BACK SURGERY      No family history on file. Social History:  reports that he has been smoking cigarettes.  He has been smoking about 1.00 pack per day. He does not have any smokeless tobacco history on file. He reports that he does not drink alcohol or use drugs.  Allergies:  Allergies  Allergen Reactions  . Ibuprofen Other (See Comments)    Upset stomach, shaking, tremors, magnifies headache     (Not in a hospital admission)  No results found for this or any previous visit (from the past 48 hour(s)). No results found.  Review of Systems  Constitutional: Negative.   HENT: Negative.   Eyes: Negative.   Respiratory: Negative.   Cardiovascular: Negative.   Gastrointestinal: Negative.   Genitourinary: Negative.   Musculoskeletal: Positive for back pain.  Skin: Negative.   Neurological: Positive for  sensory change and focal weakness.  Psychiatric/Behavioral: Negative.     There were no vitals taken for this visit. Physical Exam  Constitutional: He is oriented to person, place, and time. He appears well-developed.  HENT:  Head: Normocephalic.  Eyes: Pupils are equal, round, and reactive to light.  Neck: Normal range of motion.  Cardiovascular: Normal rate.  Respiratory: Effort normal.  GI: Soft.  Musculoskeletal:  Moderate distress. Walks an antalgic gait. Mood and affect is appropriate is tender left proximal gluteus. Straight leg raise buttock that calf pain on the left negative on the right. EHLs 4+/5 left compared to the right. Sensory exam is intact. No Babinski or clonus. No instability hips knees and ankles. Pulses intact. Pelvis stable. Abdomen soft nontender. Upper extremities motor is 5/5. Normoreflexic. Sensory exam is intact. No Hoffmann sign.   Neurological: He is alert and oriented to person, place, and time.  Skin: Skin is warm and dry.    MRI demonstrates paracentral disc herniation at 4 5 to the left deflecting the left L5 nerve root. There is underlying disc degeneration as well. MRI clearly shows that on the series 4 image 40/64 on the T2 sagittal image as well as the axial T2 image series 5 image 10 out of 18.  Assessment/Plan This point time the patient has a received only temporary relief from the epidural steroid injection. He has a radicular pain into the left leg the L5 nerve root distribution. He is neurotension signs and EHL weakness.  He continues  to remain out of work as there is no light duty available. He reports he is unable to do so due to his left leg pain. He has minimal back pain.  2 options at this point time 1 conservative I would consider up maximum medical improvement in a radiant release versus consideration of a microlumbar decompression at L4-5 on the left. He does not want to live with his symptoms. 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  I had an extensive discussion of the risks and benefits of the lumbar decompression with the patient including bleeding, infection, damage to neurovascular structures, epidural fibrosis, CSF leak requiring repair. We also discussed increase in pain, adjacent segment disease, recurrent disc herniation, need for future surgery including repeat decompression and/or fusion. We also discussed risks of postoperative hematoma, paralysis, anesthetic complications including DVT, PE, death, cardiopulmonary dysfunction. In addition, the perioperative and postoperative courses were discussed in detail including the rehabilitative time and return to functional activity and work. I provided the patient with an illustrated handout and utilized the appropriate surgical models.  We discussed the risks and benefits including bleeding, infection damage to neurovascular structures no changes in his symptoms worsen in symptoms DVT PE anesthetic complications etc.  Overnight in the hospital  Sutures out in 2 weeks  Light duty available at 6 weeks and max medical improvement 3 months postoperatively. Is instructed to quit smoking advised him against that indicating the deleterious side effects upon healing and scar tissue.Continue light duty restriction in the interim no lifting over 10 pounds or repetitive bending stooping and squatting.  I do feel this is related to his initial injury.   Dorothy SparkBISSELL, JACLYN M., PA-C  For Dr. Shelle IronBeane 03/25/2017, 11:34 AM

## 2017-03-30 NOTE — Patient Instructions (Signed)
Aaron Livingston  03/30/2017   Your procedure is scheduled on: 04-06-17  [facility]  Entrance [place]  elevators to 3rd floor to  Short Stay Center at    0530 AM.    Call this number if you have problems the morning of surgery [phone]    Remember: ONLY 1 PERSON MAY GO WITH YOU TO SHORT STAY TO GET  READY MORNING OF YOUR SURGERY.  Do not eat food or drink liquids :After Midnight.     Take these medicines the morning of surgery with A SIP OF WATER: none                                You may not have any metal on your body including hair pins and              piercings  Do not wear jewelry,  lotions, powders or perfumes, deodorant                     Men may shave face and neck.   Do not bring valuables to the hospital. [facility] IS NOT             RESPONSIBLE   FOR VALUABLES.  Contacts, dentures or bridgework may not be worn into surgery.  Leave suitcase in the car. After surgery it may be brought to your room.               Please read over the following fact sheets you were given: _____________________________________________________________________           [facility] - Preparing for [facility] Before surgery, you can play an important role.  Because skin is not sterile, your skin needs to be as free of germs as possible.  You can reduce the number of germs on your skin by washing with CHG (chlorahexidine gluconate) soap before surgery.  CHG is an antiseptic cleaner which kills germs and bonds with the skin to continue killing germs even after washing. Please DO NOT use if you have an allergy to CHG or antibacterial soaps.  If your skin becomes reddened/irritated stop using the CHG and inform your nurse when you arrive at Short Stay. Do not shave (including legs and underarms) for at least 48 hours prior to the first CHG shower.  You may shave your face/neck. Please follow these instructions carefully:  1.  Shower with CHG Soap the  night before surgery and the  morning of Surgery.  2.  If you choose to wash your hair, wash your hair first as usual with your  normal  shampoo.  3.  After you shampoo, rinse your hair and body thoroughly to remove the  shampoo.                           4.  Use CHG as you would any other liquid soap.  You can apply chg directly  to the skin and wash                       Gently with a scrungie or clean washcloth.  5.  Apply the CHG Soap to your body ONLY FROM THE NECK DOWN.   Do not use on face/ open  Wound or open sores. Avoid contact with eyes, ears mouth and genitals (private parts).                       Wash face,  Genitals (private parts) with your normal soap.             6.  Wash thoroughly, paying special attention to the area where your surgery  will be performed.  7.  Thoroughly rinse your body with warm water from the neck down.  8.  DO NOT shower/wash with your normal soap after using and rinsing off  the CHG Soap.                9.  Pat yourself dry with a clean towel.            10.  Wear clean pajamas.            11.  Place clean sheets on your bed the night of your first shower and do not  sleep with pets. Day of Surgery : Do not apply any lotions/deodorants the morning of surgery.  Please wear clean clothes to the hospital/surgery center.  FAILURE TO FOLLOW THESE INSTRUCTIONS MAY RESULT IN THE CANCELLATION OF YOUR SURGERY PATIENT SIGNATURE_________________________________  NURSE SIGNATURE__________________________________  ________________________________________________________________________   Aaron Livingston  An incentive spirometer is a tool that can help keep your lungs clear and active. This tool measures how well you are filling your lungs with each breath. Taking long deep breaths may help reverse or decrease the chance of developing breathing (pulmonary) problems (especially infection) following:  A long period of time when you  are unable to move or be active. BEFORE THE PROCEDURE   If the spirometer includes an indicator to show your best effort, your nurse or respiratory therapist will set it to a desired goal.  If possible, sit up straight or lean slightly forward. Try not to slouch.  Hold the incentive spirometer in an upright position. INSTRUCTIONS FOR USE  1. Sit on the edge of your bed if possible, or sit up as far as you can in bed or on a chair. 2. Hold the incentive spirometer in an upright position. 3. Breathe out normally. 4. Place the mouthpiece in your mouth and seal your lips tightly around it. 5. Breathe in slowly and as deeply as possible, raising the piston or the ball toward the top of the column. 6. Hold your breath for 3-5 seconds or for as long as possible. Allow the piston or ball to fall to the bottom of the column. 7. Remove the mouthpiece from your mouth and breathe out normally. 8. Rest for a few seconds and repeat Steps 1 through 7 at least 10 times every 1-2 hours when you are awake. Take your time and take a few normal breaths between deep breaths. 9. The spirometer may include an indicator to show your best effort. Use the indicator as a goal to work toward during each repetition. 10. After each set of 10 deep breaths, practice coughing to be sure your lungs are clear. If you have an incision (the cut made at the time of surgery), support your incision when coughing by placing a pillow or rolled up towels firmly against it. Once you are able to get out of bed, walk around indoors and cough well. You may stop using the incentive spirometer when instructed by your caregiver.  RISKS AND COMPLICATIONS  Take your time so you do not get  dizzy or light-headed.  If you are in pain, you may need to take or ask for pain medication before doing incentive spirometry. It is harder to take a deep breath if you are having pain. AFTER USE  Rest and breathe slowly and easily.  It can be helpful to  keep track of a log of your progress. Your caregiver can provide you with a simple table to help with this. If you are using the spirometer at home, follow these instructions: [place] IF:   You are having difficultly using the spirometer.  You have trouble using the spirometer as often as instructed.  Your pain medication is not giving enough relief while using the spirometer.  You develop fever of 100.5 F (38.1 C) or higher. SEEK IMMEDIATE MEDICAL CARE IF:   You cough up bloody sputum that had not been present before.  You develop fever of 102 F (38.9 C) or greater.  You develop worsening pain at or near the incision site. MAKE SURE YOU:   Understand these instructions.  Will watch your condition.  Will get help right away if you are not doing well or get worse. Document Released: 08/09/2006 Document Revised: 06/21/2011 Document Reviewed: 10/10/2006 [facility] Patient Information 2014 [place], [place].   ________________________________________________________________________

## 2017-03-31 ENCOUNTER — Encounter (INDEPENDENT_AMBULATORY_CARE_PROVIDER_SITE_OTHER): Payer: Self-pay

## 2017-03-31 ENCOUNTER — Encounter (HOSPITAL_COMMUNITY): Payer: Self-pay

## 2017-03-31 ENCOUNTER — Ambulatory Visit (HOSPITAL_COMMUNITY)
Admission: RE | Admit: 2017-03-31 | Discharge: 2017-03-31 | Disposition: A | Discharge status: Home / Self Care | Payer: Worker's Compensation | Source: Ambulatory Visit | Attending: Orthopedic Surgery | Admitting: Orthopedic Surgery

## 2017-03-31 ENCOUNTER — Encounter (HOSPITAL_COMMUNITY)
Admission: RE | Admit: 2017-03-31 | Discharge: 2017-03-31 | Disposition: A | Discharge status: Home / Self Care | Payer: Worker's Compensation | Source: Ambulatory Visit | Attending: Specialist | Admitting: Specialist

## 2017-03-31 ENCOUNTER — Other Ambulatory Visit: Payer: Self-pay

## 2017-03-31 DIAGNOSIS — I700 Atherosclerosis of aorta: Secondary | ICD-10-CM | POA: Diagnosis not present

## 2017-03-31 DIAGNOSIS — Z01812 Encounter for preprocedural laboratory examination: Secondary | ICD-10-CM | POA: Insufficient documentation

## 2017-03-31 DIAGNOSIS — M5126 Other intervertebral disc displacement, lumbar region: Secondary | ICD-10-CM | POA: Insufficient documentation

## 2017-03-31 DIAGNOSIS — M5137 Other intervertebral disc degeneration, lumbosacral region: Secondary | ICD-10-CM | POA: Diagnosis not present

## 2017-03-31 HISTORY — DX: Headache, unspecified: R51.9

## 2017-03-31 HISTORY — DX: Headache: R51

## 2017-03-31 HISTORY — DX: Adverse effect of unspecified anesthetic, initial encounter: T41.45XA

## 2017-03-31 HISTORY — DX: Gastro-esophageal reflux disease without esophagitis: K21.9

## 2017-03-31 HISTORY — DX: Other complications of anesthesia, initial encounter: T88.59XA

## 2017-03-31 LAB — BASIC METABOLIC PANEL
ANION GAP: 6 (ref 5–15)
BUN: 11 mg/dL (ref 6–20)
CALCIUM: 9.3 mg/dL (ref 8.9–10.3)
CO2: 25 mmol/L (ref 22–32)
Chloride: 106 mmol/L (ref 101–111)
Creatinine, Ser: 0.76 mg/dL (ref 0.61–1.24)
Glucose, Bld: 101 mg/dL — ABNORMAL HIGH (ref 65–99)
Potassium: 4.5 mmol/L (ref 3.5–5.1)
SODIUM: 137 mmol/L (ref 135–145)

## 2017-03-31 LAB — SURGICAL PCR SCREEN
MRSA, PCR: NEGATIVE
STAPHYLOCOCCUS AUREUS: NEGATIVE

## 2017-03-31 LAB — CBC
HCT: 45.6 % (ref 39.0–52.0)
HEMOGLOBIN: 15.3 g/dL (ref 13.0–17.0)
MCH: 29 pg (ref 26.0–34.0)
MCHC: 33.6 g/dL (ref 30.0–36.0)
MCV: 86.4 fL (ref 78.0–100.0)
Platelets: 258 10*3/uL (ref 150–400)
RBC: 5.28 MIL/uL (ref 4.22–5.81)
RDW: 13.3 % (ref 11.5–15.5)
WBC: 10 10*3/uL (ref 4.0–10.5)

## 2017-04-06 ENCOUNTER — Encounter (HOSPITAL_COMMUNITY)
Admission: RE | Disposition: A | Discharge status: Home / Self Care | Payer: Self-pay | Source: Ambulatory Visit | Attending: Specialist

## 2017-04-06 ENCOUNTER — Ambulatory Visit (HOSPITAL_COMMUNITY): Payer: Worker's Compensation

## 2017-04-06 ENCOUNTER — Ambulatory Visit (HOSPITAL_COMMUNITY): Payer: Worker's Compensation | Admitting: Anesthesiology

## 2017-04-06 ENCOUNTER — Encounter (HOSPITAL_COMMUNITY): Payer: Self-pay | Admitting: Emergency Medicine

## 2017-04-06 ENCOUNTER — Other Ambulatory Visit: Payer: Self-pay

## 2017-04-06 ENCOUNTER — Ambulatory Visit (HOSPITAL_COMMUNITY)
Admission: RE | Admit: 2017-04-06 | Discharge: 2017-04-07 | Disposition: A | Discharge status: Home / Self Care | Payer: Worker's Compensation | Source: Ambulatory Visit | Attending: Specialist | Admitting: Specialist

## 2017-04-06 DIAGNOSIS — Z886 Allergy status to analgesic agent status: Secondary | ICD-10-CM | POA: Insufficient documentation

## 2017-04-06 DIAGNOSIS — M48061 Spinal stenosis, lumbar region without neurogenic claudication: Secondary | ICD-10-CM | POA: Diagnosis not present

## 2017-04-06 DIAGNOSIS — M5126 Other intervertebral disc displacement, lumbar region: Secondary | ICD-10-CM | POA: Insufficient documentation

## 2017-04-06 DIAGNOSIS — F17210 Nicotine dependence, cigarettes, uncomplicated: Secondary | ICD-10-CM | POA: Diagnosis not present

## 2017-04-06 DIAGNOSIS — R29898 Other symptoms and signs involving the musculoskeletal system: Secondary | ICD-10-CM

## 2017-04-06 HISTORY — PX: LUMBAR LAMINECTOMY/DECOMPRESSION MICRODISCECTOMY: SHX5026

## 2017-04-06 SURGERY — LUMBAR LAMINECTOMY/DECOMPRESSION MICRODISCECTOMY 1 LEVEL
Anesthesia: General | Site: Back | Laterality: Left

## 2017-04-06 MED ORDER — FENTANYL CITRATE (PF) 100 MCG/2ML IJ SOLN
25.0000 ug | INTRAMUSCULAR | Status: DC | PRN
Start: 2017-04-06 — End: 2017-04-06

## 2017-04-06 MED ORDER — OXYCODONE HCL 5 MG PO TABS
10.0000 mg | ORAL_TABLET | ORAL | Status: DC | PRN
  Filled 2017-04-06: qty 2

## 2017-04-06 MED ORDER — ONDANSETRON HCL 4 MG/2ML IJ SOLN
INTRAMUSCULAR | Status: DC | PRN
  Administered 2017-04-06: 4 mg via INTRAVENOUS

## 2017-04-06 MED ORDER — ACETAMINOPHEN 325 MG PO TABS: 650.0000 mg | ORAL_TABLET | ORAL | Status: DC | PRN

## 2017-04-06 MED ORDER — FENTANYL CITRATE (PF) 100 MCG/2ML IJ SOLN
INTRAMUSCULAR | Status: DC | PRN
  Administered 2017-04-06 (×5): 50 ug via INTRAVENOUS

## 2017-04-06 MED ORDER — FENTANYL CITRATE (PF) 250 MCG/5ML IJ SOLN
INTRAMUSCULAR | Status: AC
  Filled 2017-04-06: qty 5

## 2017-04-06 MED ORDER — MENTHOL 3 MG MT LOZG: 1.0000 | LOZENGE | OROMUCOSAL | Status: DC | PRN

## 2017-04-06 MED ORDER — RISAQUAD PO CAPS
1.0000 | ORAL_CAPSULE | Freq: Every day | ORAL | Status: DC
  Administered 2017-04-06: 1 via ORAL
  Filled 2017-04-06 (×2): qty 1

## 2017-04-06 MED ORDER — ROCURONIUM BROMIDE 50 MG/5ML IV SOSY
PREFILLED_SYRINGE | INTRAVENOUS | Status: AC
  Filled 2017-04-06: qty 15

## 2017-04-06 MED ORDER — KCL IN DEXTROSE-NACL 20-5-0.45 MEQ/L-%-% IV SOLN
INTRAVENOUS | Status: AC
  Administered 2017-04-06: 12:00:00 via INTRAVENOUS
  Filled 2017-04-06 (×2): qty 1000

## 2017-04-06 MED ORDER — BUPIVACAINE-EPINEPHRINE 0.5% -1:200000 IJ SOLN
INTRAMUSCULAR | Status: AC
  Filled 2017-04-06: qty 1

## 2017-04-06 MED ORDER — LIDOCAINE 2% (20 MG/ML) 5 ML SYRINGE
INTRAMUSCULAR | Status: AC
  Filled 2017-04-06: qty 10

## 2017-04-06 MED ORDER — ACETAMINOPHEN 10 MG/ML IV SOLN
1000.0000 mg | INTRAVENOUS | Status: AC
  Administered 2017-04-06: 1000 mg via INTRAVENOUS

## 2017-04-06 MED ORDER — OXYCODONE HCL 5 MG PO TABS: 5.0000 mg | ORAL_TABLET | ORAL | Status: DC | PRN

## 2017-04-06 MED ORDER — THROMBIN (RECOMBINANT) 5000 UNITS EX SOLR
CUTANEOUS | Status: DC | PRN
  Administered 2017-04-06: 10000 mL via TOPICAL

## 2017-04-06 MED ORDER — PROPOFOL 10 MG/ML IV BOLUS
INTRAVENOUS | Status: DC | PRN
  Administered 2017-04-06: 160 mg via INTRAVENOUS

## 2017-04-06 MED ORDER — MAGNESIUM CITRATE PO SOLN: 1.0000 | Freq: Once | ORAL | Status: DC | PRN

## 2017-04-06 MED ORDER — BUPIVACAINE HCL 0.5 % IJ SOLN
INTRAMUSCULAR | Status: DC | PRN
  Administered 2017-04-06: 9 mL

## 2017-04-06 MED ORDER — MIDAZOLAM HCL 5 MG/5ML IJ SOLN
INTRAMUSCULAR | Status: DC | PRN
  Administered 2017-04-06: 2 mg via INTRAVENOUS

## 2017-04-06 MED ORDER — METHOCARBAMOL 500 MG PO TABS
500.0000 mg | ORAL_TABLET | Freq: Four times a day (QID) | ORAL | Status: DC | PRN
Start: 2017-04-06 — End: 2017-04-07

## 2017-04-06 MED ORDER — OXYCODONE-ACETAMINOPHEN 5-325 MG PO TABS: 1.0000 | ORAL_TABLET | ORAL | 0 refills | Status: AC | PRN

## 2017-04-06 MED ORDER — POLYETHYLENE GLYCOL 3350 17 G PO PACK
17.0000 g | PACK | Freq: Every day | ORAL | Status: DC | PRN
Start: 2017-04-06 — End: 2017-04-07

## 2017-04-06 MED ORDER — OXYCODONE HCL 5 MG PO TABS: 5.0000 mg | ORAL_TABLET | Freq: Once | ORAL | Status: DC | PRN

## 2017-04-06 MED ORDER — ACETAMINOPHEN 10 MG/ML IV SOLN
INTRAVENOUS | Status: AC
  Filled 2017-04-06: qty 100

## 2017-04-06 MED ORDER — PROMETHAZINE HCL 25 MG PO TABS: 25.0000 mg | ORAL_TABLET | Freq: Four times a day (QID) | ORAL | Status: DC | PRN

## 2017-04-06 MED ORDER — THROMBIN (RECOMBINANT) 5000 UNITS EX SOLR
CUTANEOUS | Status: AC
  Filled 2017-04-06: qty 10000

## 2017-04-06 MED ORDER — HYDROMORPHONE HCL 1 MG/ML IJ SOLN: 1.0000 mg | INTRAMUSCULAR | Status: DC | PRN

## 2017-04-06 MED ORDER — ALUM & MAG HYDROXIDE-SIMETH 200-200-20 MG/5ML PO SUSP: 30.0000 mL | Freq: Four times a day (QID) | ORAL | Status: DC | PRN

## 2017-04-06 MED ORDER — DEXAMETHASONE SODIUM PHOSPHATE 10 MG/ML IJ SOLN
INTRAMUSCULAR | Status: DC | PRN
  Administered 2017-04-06: 10 mg via INTRAVENOUS

## 2017-04-06 MED ORDER — LACTATED RINGERS IV SOLN
INTRAVENOUS | Status: DC
  Administered 2017-04-06 (×2): via INTRAVENOUS

## 2017-04-06 MED ORDER — POLYETHYLENE GLYCOL 3350 17 G PO PACK: 17.0000 g | PACK | Freq: Every day | ORAL | 0 refills | Status: AC

## 2017-04-06 MED ORDER — SUGAMMADEX SODIUM 200 MG/2ML IV SOLN
INTRAVENOUS | Status: DC | PRN
  Administered 2017-04-06: 200 mg via INTRAVENOUS

## 2017-04-06 MED ORDER — ONDANSETRON HCL 4 MG PO TABS: 4.0000 mg | ORAL_TABLET | Freq: Four times a day (QID) | ORAL | Status: DC | PRN

## 2017-04-06 MED ORDER — DOCUSATE SODIUM 100 MG PO CAPS: 100.0000 mg | ORAL_CAPSULE | Freq: Two times a day (BID) | ORAL | 1 refills | Status: AC | PRN

## 2017-04-06 MED ORDER — ONDANSETRON HCL 4 MG/2ML IJ SOLN: 4.0000 mg | Freq: Four times a day (QID) | INTRAMUSCULAR | Status: DC | PRN

## 2017-04-06 MED ORDER — CEFAZOLIN SODIUM-DEXTROSE 2-4 GM/100ML-% IV SOLN
2.0000 g | Freq: Three times a day (TID) | INTRAVENOUS | Status: AC
  Administered 2017-04-06 – 2017-04-07 (×2): 2 g via INTRAVENOUS
  Filled 2017-04-06 (×2): qty 100

## 2017-04-06 MED ORDER — ACETAMINOPHEN 160 MG/5ML PO SOLN: 325.0000 mg | ORAL | Status: DC | PRN

## 2017-04-06 MED ORDER — PHENOL 1.4 % MT LIQD: 1.0000 | OROMUCOSAL | Status: DC | PRN

## 2017-04-06 MED ORDER — ONDANSETRON HCL 4 MG/2ML IJ SOLN
INTRAMUSCULAR | Status: AC
  Filled 2017-04-06: qty 4

## 2017-04-06 MED ORDER — MIDAZOLAM HCL 2 MG/2ML IJ SOLN
INTRAMUSCULAR | Status: AC
  Filled 2017-04-06: qty 2

## 2017-04-06 MED ORDER — DEXAMETHASONE SODIUM PHOSPHATE 10 MG/ML IJ SOLN
INTRAMUSCULAR | Status: AC
  Filled 2017-04-06: qty 2

## 2017-04-06 MED ORDER — DEXTROSE 5 % IV SOLN
500.0000 mg | Freq: Four times a day (QID) | INTRAVENOUS | Status: DC | PRN
  Administered 2017-04-06: 500 mg via INTRAVENOUS
  Filled 2017-04-06: qty 5

## 2017-04-06 MED ORDER — PROPOFOL 10 MG/ML IV BOLUS
INTRAVENOUS | Status: AC
  Filled 2017-04-06: qty 60

## 2017-04-06 MED ORDER — DOCUSATE SODIUM 100 MG PO CAPS
100.0000 mg | ORAL_CAPSULE | Freq: Two times a day (BID) | ORAL | Status: DC
  Administered 2017-04-06: 100 mg via ORAL
  Filled 2017-04-06 (×2): qty 1

## 2017-04-06 MED ORDER — CEFAZOLIN SODIUM-DEXTROSE 2-4 GM/100ML-% IV SOLN
2.0000 g | INTRAVENOUS | Status: AC
  Administered 2017-04-06: 2 g via INTRAVENOUS

## 2017-04-06 MED ORDER — ACETAMINOPHEN 325 MG PO TABS: 325.0000 mg | ORAL_TABLET | ORAL | Status: DC | PRN

## 2017-04-06 MED ORDER — ROCURONIUM BROMIDE 10 MG/ML (PF) SYRINGE
PREFILLED_SYRINGE | INTRAVENOUS | Status: DC | PRN
  Administered 2017-04-06: 10 mg via INTRAVENOUS
  Administered 2017-04-06: 50 mg via INTRAVENOUS
  Administered 2017-04-06: 10 mg via INTRAVENOUS

## 2017-04-06 MED ORDER — SODIUM CHLORIDE 0.9 % IV SOLN
INTRAVENOUS | Status: AC
  Filled 2017-04-06: qty 500000

## 2017-04-06 MED ORDER — CEFAZOLIN SODIUM-DEXTROSE 2-4 GM/100ML-% IV SOLN
INTRAVENOUS | Status: AC
  Filled 2017-04-06: qty 100

## 2017-04-06 MED ORDER — THROMBIN (RECOMBINANT) 5000 UNITS EX SOLR: OROMUCOSAL | Status: DC | PRN

## 2017-04-06 MED ORDER — SODIUM CHLORIDE 0.9 % IV SOLN
INTRAVENOUS | Status: DC | PRN
  Administered 2017-04-06: 500 mL

## 2017-04-06 MED ORDER — BISACODYL 5 MG PO TBEC: 5.0000 mg | DELAYED_RELEASE_TABLET | Freq: Every day | ORAL | Status: DC | PRN

## 2017-04-06 MED ORDER — SUGAMMADEX SODIUM 200 MG/2ML IV SOLN
INTRAVENOUS | Status: AC
  Filled 2017-04-06: qty 2

## 2017-04-06 MED ORDER — PANTOPRAZOLE SODIUM 40 MG PO TBEC
80.0000 mg | DELAYED_RELEASE_TABLET | Freq: Every day | ORAL | Status: DC
  Administered 2017-04-06: 80 mg via ORAL
  Filled 2017-04-06 (×2): qty 2

## 2017-04-06 MED ORDER — OXYCODONE HCL 5 MG/5ML PO SOLN
5.0000 mg | Freq: Once | ORAL | Status: DC | PRN
  Filled 2017-04-06: qty 5

## 2017-04-06 MED ORDER — ACETAMINOPHEN 650 MG RE SUPP
650.0000 mg | RECTAL | Status: DC | PRN
Start: 2017-04-06 — End: 2017-04-07

## 2017-04-06 MED ORDER — METHOCARBAMOL 500 MG PO TABS: 500.0000 mg | ORAL_TABLET | Freq: Four times a day (QID) | ORAL | 1 refills | Status: AC | PRN

## 2017-04-06 SURGICAL SUPPLY — 48 items
BAG ZIPLOCK 12X15 (MISCELLANEOUS) IMPLANT
CLEANER TIP ELECTROSURG 2X2 (MISCELLANEOUS) ×3 IMPLANT
CLOSURE WOUND 1/2 X4 (GAUZE/BANDAGES/DRESSINGS) ×1
CLOTH 2% CHLOROHEXIDINE 3PK (PERSONAL CARE ITEMS) ×3 IMPLANT
COVER SURGICAL LIGHT HANDLE (MISCELLANEOUS) ×3 IMPLANT
DRAPE MICROSCOPE LEICA (MISCELLANEOUS) ×3 IMPLANT
DRAPE POUCH INSTRU U-SHP 10X18 (DRAPES) ×3 IMPLANT
DRAPE SHEET LG 3/4 BI-LAMINATE (DRAPES) ×3 IMPLANT
DRAPE SURG 17X11 SM STRL (DRAPES) ×3 IMPLANT
DRAPE UTILITY XL STRL (DRAPES) ×3 IMPLANT
DRSG AQUACEL AG ADV 3.5X 4 (GAUZE/BANDAGES/DRESSINGS) ×3 IMPLANT
DRSG AQUACEL AG ADV 3.5X 6 (GAUZE/BANDAGES/DRESSINGS) IMPLANT
DURAPREP 26ML APPLICATOR (WOUND CARE) ×3 IMPLANT
DURASEAL SPINE SEALANT 3ML (MISCELLANEOUS) IMPLANT
ELECT BLADE TIP CTD 4 INCH (ELECTRODE) IMPLANT
ELECT REM PT RETURN 15FT ADLT (MISCELLANEOUS) ×3 IMPLANT
GLOVE BIOGEL PI IND STRL 7.0 (GLOVE) ×1 IMPLANT
GLOVE BIOGEL PI INDICATOR 7.0 (GLOVE) ×2
GLOVE SURG SS PI 7.0 STRL IVOR (GLOVE) ×3 IMPLANT
GLOVE SURG SS PI 7.5 STRL IVOR (GLOVE) ×6 IMPLANT
GLOVE SURG SS PI 8.0 STRL IVOR (GLOVE) ×6 IMPLANT
GOWN STRL REUS W/TWL XL LVL3 (GOWN DISPOSABLE) ×9 IMPLANT
HEMOSTAT SPONGE AVITENE ULTRA (HEMOSTASIS) IMPLANT
IV CATH 14GX2 1/4 (CATHETERS) ×3 IMPLANT
KIT BASIN OR (CUSTOM PROCEDURE TRAY) ×3 IMPLANT
KIT POSITIONING SURG ANDREWS (MISCELLANEOUS) ×3 IMPLANT
MANIFOLD NEPTUNE II (INSTRUMENTS) ×3 IMPLANT
NEEDLE SPNL 18GX3.5 QUINCKE PK (NEEDLE) ×6 IMPLANT
PACK LAMINECTOMY ORTHO (CUSTOM PROCEDURE TRAY) ×3 IMPLANT
PATTIES SURGICAL .5 X.5 (GAUZE/BANDAGES/DRESSINGS) IMPLANT
PATTIES SURGICAL .75X.75 (GAUZE/BANDAGES/DRESSINGS) IMPLANT
PATTIES SURGICAL 1X1 (DISPOSABLE) IMPLANT
RUBBERBAND STERILE (MISCELLANEOUS) ×6 IMPLANT
SPONGE SURGIFOAM ABS GEL 100 (HEMOSTASIS) ×3 IMPLANT
STAPLER VISISTAT (STAPLE) IMPLANT
STRIP CLOSURE SKIN 1/2X4 (GAUZE/BANDAGES/DRESSINGS) ×2 IMPLANT
SUT NURALON 4 0 TR CR/8 (SUTURE) IMPLANT
SUT PROLENE 3 0 PS 2 (SUTURE) ×3 IMPLANT
SUT VIC AB 1 CT1 27 (SUTURE)
SUT VIC AB 1 CT1 27XBRD ANTBC (SUTURE) IMPLANT
SUT VIC AB 1-0 CT2 27 (SUTURE) ×3 IMPLANT
SUT VIC AB 2-0 CT1 27 (SUTURE)
SUT VIC AB 2-0 CT1 TAPERPNT 27 (SUTURE) IMPLANT
SUT VIC AB 2-0 CT2 27 (SUTURE) ×3 IMPLANT
SYR 3ML LL SCALE MARK (SYRINGE) IMPLANT
TOWEL OR 17X26 10 PK STRL BLUE (TOWEL DISPOSABLE) ×3 IMPLANT
TOWEL OR NON WOVEN STRL DISP B (DISPOSABLE) IMPLANT
YANKAUER SUCT BULB TIP NO VENT (SUCTIONS) IMPLANT

## 2017-04-06 NOTE — Transfer of Care (Signed)
Immediate Anesthesia Transfer of Care Note  Patient: Aaron Livingston  Procedure(s) Performed: Microlumbar decompression L4-5 Left (Left Back)  Patient Location: PACU  Anesthesia Type:General  Level of Consciousness: awake, alert  and oriented  Airway & Oxygen Therapy: Patient Spontanous Breathing and Patient connected to face mask oxygen  Post-op Assessment: Report given to RN  Post vital signs: Reviewed and stable  Last Vitals:  Vitals:   04/06/17 0529  BP: 138/78  Pulse: 68  Resp: 18  Temp: 36.4 C  SpO2: 100%    Last Pain:  Vitals:   04/06/17 0543  TempSrc:   PainSc: 3          Complications: No apparent anesthesia complications

## 2017-04-06 NOTE — Anesthesia Procedure Notes (Signed)
Procedure Name: Intubation Date/Time: 04/06/2017 7:26 AM Performed by: Lavina Hamman, CRNA Pre-anesthesia Checklist: Patient identified, Emergency Drugs available, Suction available, Patient being monitored and Timeout performed Patient Re-evaluated:Patient Re-evaluated prior to induction Oxygen Delivery Method: Circle system utilized Preoxygenation: Pre-oxygenation with 100% oxygen Induction Type: IV induction Ventilation: Mask ventilation without difficulty Laryngoscope Size: Mac and 4 Grade View: Grade II Tube type: Oral Tube size: 7.5 mm Number of attempts: 1 Airway Equipment and Method: Stylet Placement Confirmation: ETT inserted through vocal cords under direct vision,  positive ETCO2,  CO2 detector and breath sounds checked- equal and bilateral Secured at: 23 cm Tube secured with: Tape Dental Injury: Teeth and Oropharynx as per pre-operative assessment

## 2017-04-06 NOTE — Interval H&P Note (Signed)
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 After consideration of risks, benefits and other options for treatment, the patient has consented to  Procedure(s): Microlumbar decompression L4-5 Left (Left) as a surgical intervention .  The patient's history has been reviewed, patient examined, no change in status, stable for surgery.  I have reviewed the patient's chart and labs.  Questions were answered to the patient's satisfaction.     Natallie Ravenscroft C

## 2017-04-06 NOTE — Plan of Care (Signed)
Plan of care discussed.   

## 2017-04-06 NOTE — Anesthesia Preprocedure Evaluation (Addendum)
Anesthesia Evaluation  Patient identified by MRN, date of birth, ID band Patient awake    Reviewed: Allergy & Precautions, NPO status , Patient's Chart, lab work & pertinent test results  History of Anesthesia Complications Negative for: history of anesthetic complications  Airway Mallampati: III  TM Distance: >3 FB Neck ROM: Full    Dental  (+) Teeth Intact   Pulmonary Current Smoker,    breath sounds clear to auscultation       Cardiovascular negative cardio ROS   Rhythm:Regular     Neuro/Psych  Headaches, negative psych ROS   GI/Hepatic Neg liver ROS, GERD  Medicated and Controlled,  Endo/Other  negative endocrine ROS  Renal/GU negative Renal ROS     Musculoskeletal negative musculoskeletal ROS (+)   Abdominal   Peds  Hematology negative hematology ROS (+)   Anesthesia Other Findings   Reproductive/Obstetrics                            Anesthesia Physical Anesthesia Plan  ASA: II  Anesthesia Plan: General   Post-op Pain Management:    Induction: Intravenous  PONV Risk Score and Plan: 1 and Ondansetron and Dexamethasone  Airway Management Planned: Oral ETT  Additional Equipment: None  Intra-op Plan:   Post-operative Plan: Extubation in OR  Informed Consent: I have reviewed the patients History and Physical, chart, labs and discussed the procedure including the risks, benefits and alternatives for the proposed anesthesia with the patient or authorized representative who has indicated his/her understanding and acceptance.   Dental advisory given  Plan Discussed with: CRNA and Surgeon  Anesthesia Plan Comments:         Anesthesia Quick Evaluation

## 2017-04-06 NOTE — Op Note (Signed)
NAMDocia Livingston:  Aaron Livingston, Aaron Livingston               ACCOUNT NO.:  [id number]  MEDICAL RECORD NO.:  [id number]  LOCATION:  PERIO                        [facility]  PHYSICIAN:  Jene EveryJeffrey Luan Urbani, M.D.    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  POSTOPERATIVE DIAGNOSIS:  Spinal stenosis, herniated nucleus pulposus, L4-5.  PROCEDURE PERFORMED:  Microlumbar decompression, L4-5 left; foraminotomies, L4-L5 left; microdiskectomy, L4-5 left.  ANESTHESIA:  General.  ASSISTANT:  Lanna PocheJacqueline Bissell, PA.  SPECIMEN:  To pathology L4-5 disk.  HISTORY:  A 46, HNP and stenosis L4-5, compressing the L5 root, neural tension signs, EHL weakness, refractory conservative treatment, indicated for decompression of the L5 root by microdiskectomy and lumbar decompression.  Risks and benefits were discussed including bleeding, infection, damage to the neurovascular structures, no change in symptoms, worsening symptoms, DVT, PE, anesthetic complications, etc.  TECHNIQUE:  With the patient in supine position after induction of adequate general anesthesia, 2 g of Kefzol, placed prone on the [place] frame.  All bony prominences were well padded.  Lumbar region was prepped and draped in usual sterile fashion.  Two 18-gauge spinal needles were utilized to localize L4-5 interspace, confirmed with x-ray. Incision was made from spinous process, L4-5.  He had a previous surgical incision used for the L5-S1 space.  Subcutaneous tissue dissected.  Electrocautery was utilized to achieve hemostasis. Dorsolumbar fascia was divided in line with the skin incision. Paraspinous muscle elevated from lamina of L4 and L5.  McCullough retractor was placed, operating microscope draped and brought on the surgical field.  Confirmatory radiograph obtained at L4-5 after microscope  was draped and brought on the surgical field.  Hemilaminotomy of the caudad edge of L5 was performed.  A Leksell rongeur and a 3 mm Kerrison preserving the pars detached in the inferior edge of the ligamentum, ligamentum detached from the cephalad edge of L5 utilizing a straight microcurette.  Neuro patty placed beneath the ligamentum to protect the L5 root.  L5 root was identified and gently mobilized medially.  Foraminotomy of L5 was performed.  D'Errico was used to protect the L5 root, decompressed lateral recess to the medial border of the pedicle performing a foraminotomy of L4 as well.  Severe stenosis was noted in the lateral recess secondary to disk herniation and facet hypertrophy.  Focal HNP was noted and annulotomy was performed, and copious portion of disk material was removed from the disk space with straight and upbiting pituitary further mobilized with an Epstein preserving the endplates.  Multiple passes were made and multiple fragments were retrieved.  This further irrigated with catheter, antibiotic lavage with additional fragments retrieved following this. After full diskectomy of herniated material, there was no residual disk herniation noted.  We checked beneath thecal sac, the foramen of L4 and L5, the axilla, and the shoulder of the root without residual fragments noted.  There was 1 cm of excursion of the L5 root medial to pedicle without tension.  No evidence of CSF leakage or active bleeding. Epidural fat was draped over the L5 nerve root.  Removed the microscope. [facility]oved the [facility].  No active bleeding.  Closed the dorsolumbar fascia with 1 Vicryl, subcu with 2-0, and skin with Prolene. Sterile dressing applied.  Placed supine on the hospital bed, extubated without difficulty and transported to the recovery room in satisfactory condition.  The patient tolerated the procedure well.  No complications.  Assistant, Lanna PocheJacqueline Bissell, [place].  Minimal  blood loss.     Jene EveryJeffrey Manaal Mandala, M.D.     Cordelia PenJB/MEDQ  D:  04/06/2017  T:  04/06/2017  Job:  [id number]

## 2017-04-06 NOTE — Discharge Instructions (Signed)
Walk As Tolerated utilizing back precautions.  No bending, twisting, or lifting.  No driving for 2 weeks.   °Aquacel dressing may remain in place until follow up. May shower with aquacel dressing in place. If the dressing peels off or becomes saturated, you may remove aquacel dressing and place gauze and tape dressing which should be kept clean and dry and changed daily. Do not remove steri-strips if they are present. °See Dr. Arlynn Mcdermid in office in 10 to 14 days. Begin taking aspirin 81mg per day starting 4 days after your surgery if not allergic to aspirin or on another blood thinner. °Walk daily even outside. Use a cane or walker only if necessary. °Avoid sitting on soft sofas. ° °

## 2017-04-06 NOTE — Brief Op Note (Signed)
04/06/2017  8:29 AM  PATIENT:  Aaron Livingston  46 y.o. 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  POST-OPERATIVE DIAGNOSIS:  HNP, stenosis L4-5 Left  PROCEDURE:  Procedure(s): Microlumbar decompression L4-5 Left (Left)  SURGEON:  Surgeon(s) and Role:    Jene Every* Tore Carreker, MD - Primary  PHYSICIAN ASSISTANT:   ASSISTANTS: Bissell   ANESTHESIA:   general  EBL:  minimal   BLOOD ADMINISTERED:none  DRAINS: none   LOCAL MEDICATIONS USED:  MARCAINE     SPECIMEN:  Source of Specimen:  L45  DISPOSITION OF SPECIMEN:  PATHOLOGY  COUNTS:  YES  TOURNIQUET:  * No tourniquets in log *  DICTATION: .Note written in EPIC and Other Dictation: Dictation Number [id number]  PLAN OF CARE: Admit for overnight observation  PATIENT DISPOSITION:  PACU - hemodynamically stable.   Delay start of Pharmacological VTE agent (>24hrs) due to surgical blood loss or risk of bleeding: yes

## 2017-04-06 NOTE — Anesthesia Postprocedure Evaluation (Signed)
Anesthesia Post Note  Patient: Aaron Livingston  Procedure(s) Performed: Microlumbar decompression L4-5 Left (Left Back)     Patient location during evaluation: PACU Anesthesia Type: General Level of consciousness: awake and alert Pain management: pain level controlled Vital Signs Assessment: post-procedure vital signs reviewed and stable Respiratory status: spontaneous breathing, nonlabored ventilation, respiratory function stable and patient connected to nasal cannula oxygen Cardiovascular status: blood pressure returned to baseline and stable Postop Assessment: no apparent nausea or vomiting Anesthetic complications: no    Last Vitals:  Vitals:   04/06/17 1215 04/06/17 1300  BP: 121/75 125/78  Pulse: (!) 59 60  Resp: 16 16  Temp: 36.7 C 36.8 C  SpO2: 98% 97%    Last Pain:  Vitals:   04/06/17 1300  TempSrc: Oral  PainSc:                  Brekyn Huntoon

## 2017-04-07 DIAGNOSIS — M5126 Other intervertebral disc displacement, lumbar region: Secondary | ICD-10-CM | POA: Diagnosis not present

## 2017-04-07 LAB — BASIC METABOLIC PANEL
Anion gap: 5 (ref 5–15)
BUN: 9 mg/dL (ref 6–20)
CALCIUM: 8.8 mg/dL — AB (ref 8.9–10.3)
CO2: 24 mmol/L (ref 22–32)
CREATININE: 0.72 mg/dL (ref 0.61–1.24)
Chloride: 108 mmol/L (ref 101–111)
GFR calc Af Amer: >60 mL/min (ref >60)
GFR calc non Af Amer: >60 mL/min (ref >60)
GLUCOSE: 111 mg/dL — AB (ref 65–99)
Potassium: 4 mmol/L (ref 3.5–5.1)
Sodium: 137 mmol/L (ref 135–145)

## 2017-04-07 NOTE — Progress Notes (Signed)
Discharge planning, no HH needs identified. Has DME. [phone] 

## 2017-04-07 NOTE — Evaluation (Signed)
Physical Therapy Evaluation Patient Details Name: Aaron Livingston E Fetch MRN: [id number] DOB: [birth year] Today's Date: 04/07/2017   History of Present Illness  s/p microdecompression L4-5. PMH: back sx 2005  Clinical Impression  Pt s/p back surgery and presents with post op pain and back precautions limiting functional mobility.  Pt is currently mobilizing at sup to MOD I level and plans dc home this date with assist of family.    Follow Up Recommendations No PT follow up    Equipment Recommendations  None recommended by PT    Recommendations for Other Services       Precautions / Restrictions Precautions Precautions: Back Precaution Booklet Issued: Yes (comment) Precaution Comments: Pt able to recall all back precautions with increased time Restrictions Weight Bearing Restrictions: No      Mobility  Bed Mobility Overal bed mobility: Modified Independent             General bed mobility comments: no cues for log roll  Transfers Overall transfer level: Needs assistance   Transfers: Sit to/from Stand Sit to Stand: Supervision         General transfer comment: cues for adherence to back precautions and for use of UEs to self assist  Ambulation/Gait Ambulation/Gait assistance: Supervision;Independent Ambulation Distance (Feet): 500 Feet Assistive device: None Gait Pattern/deviations: Step-through pattern;Decreased step length - right;Decreased step length - left;Shuffle;Wide base of support Gait velocity: decr Gait velocity interpretation: Below normal speed for age/gender General Gait Details: Pt demonstrating good stability and appropriate speed  Stairs Stairs: Yes Stairs assistance: Supervision Stair Management: Two rails;Step to pattern;Forwards Number of Stairs: 3    Wheelchair Mobility    Modified Rankin (Stroke Patients Only)       Balance Overall balance assessment: No apparent balance deficits (not formally assessed)                                            Pertinent Vitals/Pain Pain Assessment: 0-10 Pain Score: 1  Pain Location: back Pain Descriptors / Indicators: Operative site guarding Pain Intervention(s): Limited activity within patient's tolerance;Monitored during session    Home Living Family/patient expects to be discharged to:: Private residence Living Arrangements: Spouse/significant other Available Help at Discharge: Family Type of Home: House Home Access: Stairs to enter Entrance Stairs-Rails: Right;Left;Can reach both Secretary/administratorntrance Stairs-Number of Steps: 3 Home Layout: One level Home Equipment: None      Prior Function Level of Independence: Independent         Comments: works as a Paramediclandscaper     Hand Dominance   Dominant Hand: Right    Extremity/Trunk Assessment   Upper Extremity Assessment Upper Extremity Assessment: Overall WFL for tasks assessed    Lower Extremity Assessment Lower Extremity Assessment: Overall WFL for tasks assessed    Cervical / Trunk Assessment Cervical / Trunk Assessment: Other exceptions Cervical / Trunk Exceptions: s/p back sx  Communication   Communication: No difficulties  Cognition Arousal/Alertness: Awake/alert Behavior During Therapy: WFL for tasks assessed/performed Overall Cognitive Status: Within Functional Limits for tasks assessed                                        General Comments      Exercises     Assessment/Plan    PT Assessment Patent does not need any  further PT services  PT Problem List         PT Treatment Interventions      PT Goals (Current goals can be found in the Care Plan section)  Acute Rehab PT Goals Patient Stated Goal: to return home today PT Goal Formulation: All assessment and education complete, DC therapy    Frequency     Barriers to discharge        Co-evaluation               AM-PAC PT "6 Clicks" Daily Activity  Outcome Measure Difficulty turning over in  bed (including adjusting bedclothes, sheets and blankets)?: A Little Difficulty moving from lying on back to sitting on the side of the bed? : A Little Difficulty sitting down on and standing up from a chair with arms (e.g., wheelchair, bedside commode, etc,.)?: A Little Help needed moving to and from a bed to chair (including a wheelchair)?: None Help needed walking in hospital room?: None Help needed climbing 3-5 steps with a railing? : A Little 6 Click Score: 20    End of Session   Activity Tolerance: Patient tolerated treatment well Patient left: in bed;with call bell/phone within reach;with family/visitor present Nurse Communication: Mobility status PT Visit Diagnosis: Difficulty in walking, not elsewhere classified (R26.2)    Time: [id number] PT Time Calculation (min) (ACUTE ONLY): 12 min   Charges:   PT Evaluation $PT Eval Low Complexity: 1 Low     PT G Codes:   PT G-Codes **NOT FOR INPATIENT CLASS** Functional Assessment Tool Used: Clinical judgement Functional Limitation: Mobility: Walking and moving around Mobility: Walking and Moving Around Current Status (V4098(G8978): At least 1 percent but less than 20 percent impaired, limited or restricted Mobility: Walking and Moving Around Goal Status [phone](G8979): At least 1 percent but less than 20 percent impaired, limited or restricted Mobility: Walking and Moving Around Discharge Status [phone](G8980): At least 1 percent but less than 20 percent impaired, limited or restricted    Pg [phone]   Conner Neiss 04/07/2017, 11:30 AM

## 2017-04-07 NOTE — Evaluation (Signed)
Occupational Therapy Evaluation and Discharge Patient Details Name: Aaron Livingston MRN: [id number] DOB: 1970-08-17 Today's Date: 04/07/2017    History of Present Illness s/p microdecompression L4-5. PMH: back sx 2005   Clinical Impression    Pt is functioning at a modified independent level in ADL and IADL. Reinforced back precautions related to ADL and IADL. No further OT needs.  Follow Up Recommendations  No OT follow up    Equipment Recommendations  None recommended by OT    Recommendations for Other Services       Precautions / Restrictions Precautions Precautions: Back Precaution Booklet Issued: Yes (comment) Restrictions Weight Bearing Restrictions: No      Mobility Bed Mobility Overal bed mobility: Modified Independent             General bed mobility comments: no cues for log roll  Transfers Overall transfer level: Modified independent                    Balance                                           ADL either performed or assessed with clinical judgement   ADL Overall ADL's : Modified independent                                       General ADL Comments: pt already aware of back prcautions from previous sx, reinforced     Vision Baseline Vision/History: No visual deficits       Perception     Praxis      Pertinent Vitals/Pain Pain Assessment: 0-10 Pain Score: 1  Pain Location: back Pain Descriptors / Indicators: Operative site guarding Pain Intervention(s): Monitored during session     Hand Dominance Right   Extremity/Trunk Assessment Upper Extremity Assessment Upper Extremity Assessment: Overall WFL for tasks assessed   Lower Extremity Assessment Lower Extremity Assessment: Defer to PT evaluation   Cervical / Trunk Assessment Cervical / Trunk Assessment: Other exceptions Cervical / Trunk Exceptions: s/p back sx   Communication Communication Communication: No difficulties    Cognition Arousal/Alertness: Awake/alert Behavior During Therapy: WFL for tasks assessed/performed Overall Cognitive Status: Within Functional Limits for tasks assessed                                     General Comments       Exercises     Shoulder Instructions      Home Living Family/patient expects to be discharged to:: Private residence Living Arrangements: Spouse/significant other Available Help at Discharge: Family Type of Home: House Home Access: Stairs to enter Secretary/administratorntrance Stairs-Number of Steps: 3 Entrance Stairs-Rails: Right;Left;Can reach both Home Layout: One level     Bathroom Shower/Tub: Chief Strategy OfficerTub/shower unit   Bathroom Toilet: Standard     Home Equipment: None          Prior Functioning/Environment Level of Independence: Independent        Comments: works as a Paramediclandscaper        OT Problem List: Pain      OT Treatment/Interventions:      OT Goals(Current goals can be found in the care plan section) Acute Rehab OT Goals Patient Stated Goal:  to return home today  OT Frequency:     Barriers to D/C:            Co-evaluation              AM-PAC PT "6 Clicks" Daily Activity     Outcome Measure Help from another person eating meals?: None Help from another person taking care of personal grooming?: None Help from another person toileting, which includes using toliet, bedpan, or urinal?: None Help from another person bathing (including washing, rinsing, drying)?: None Help from another person to put on and taking off regular upper body clothing?: None Help from another person to put on and taking off regular lower body clothing?: None 6 Click Score: 24   End of Session    Activity Tolerance: Patient tolerated treatment well Patient left: in bed;with call bell/phone within reach;with family/visitor present  OT Visit Diagnosis: Pain                Time: [id number] OT Time Calculation (min): 15 min Charges:  OT General  Charges $OT Visit: 1 Visit OT Evaluation $OT Eval Low Complexity: 1 Low G-Codes: OT G-codes **NOT FOR INPATIENT CLASS** Functional Assessment Tool Used: Clinical judgement Functional Limitation: Self care Self Care Current Status (V4098(G8987): At least 1 percent but less than 20 percent impaired, limited or restricted Self Care Goal Status (J1914(G8988): At least 1 percent but less than 20 percent impaired, limited or restricted Self Care Discharge Status [phone](G8989): At least 1 percent but less than 20 percent impaired, limited or restricted   04/07/2017 Martie RoundJulie Saulo Anthis, OTR/L Pager: [phone] Aaron Livingston, Aaron Livingston 04/07/2017, 8:55 AM

## 2017-04-07 NOTE — Progress Notes (Signed)
Subjective: 1 Day Post-Op Procedure(s) (LRB): Microlumbar decompression L4-5 Left (Left) Patient reports pain as 3 on 0-10 scale.  No leg pain  Objective: Vital signs in last 24 hours: Temp:  [97.5 F (36.4 C)-98.8 F (37.1 C)] 98.6 F (37 C) (12/27 0554) Pulse Rate:  [55-87] 62 (12/27 0554) Resp:  [14-16] 16 (12/27 0554) BP: (99-137)/(55-91) 137/79 (12/27 0554) SpO2:  [93 %-100 %] 96 % (12/27 0554) Weight:  [91.2 kg (201 lb)] 91.2 kg (201 lb) (12/26 1000)  Intake/Output from previous day: 12/26 0701 - 12/27 0700 In: 3114.2 [P.O.:1060; I.V.:1854.2; IV Piggyback:200] Out: 1395 [Urine:1375; Blood:20] Intake/Output this shift: No intake/output data recorded.  No results for input(s): HGB in the last 72 hours. No results for input(s): WBC, RBC, HCT, PLT in the last 72 hours. Recent Labs    04/07/17 0441  NA 137  K 4.0  CL 108  CO2 24  BUN 9  CREATININE 0.72  GLUCOSE 111*  CALCIUM 8.8*   No results for input(s): LABPT, INR in the last 72 hours.  Neurologically intact Neurovascular intact Sensation intact distally Dorsiflexion/Plantar flexion intact Incision: scant drainage No DVT. NVI Assessment/Plan: 1 Day Post-Op Procedure(s) (LRB): Microlumbar decompression L4-5 Left (Left) Advance diet Up with therapy D/C IV fluids Discharge home with home health  Dressing change prior to D/C  Chevi Lim C 04/07/2017, 7:37 AM

## 2019-08-23 IMAGING — DX DG SPINE 1V PORT
1 series · 1 of 1 positions shown · non-contrast
Comparison: Radiographs dated 03/31/2017

CLINICAL DATA: Herniated lumbar disc.

EXAM:
PORTABLE SPINE - 1 VIEW

[l-spine lat]
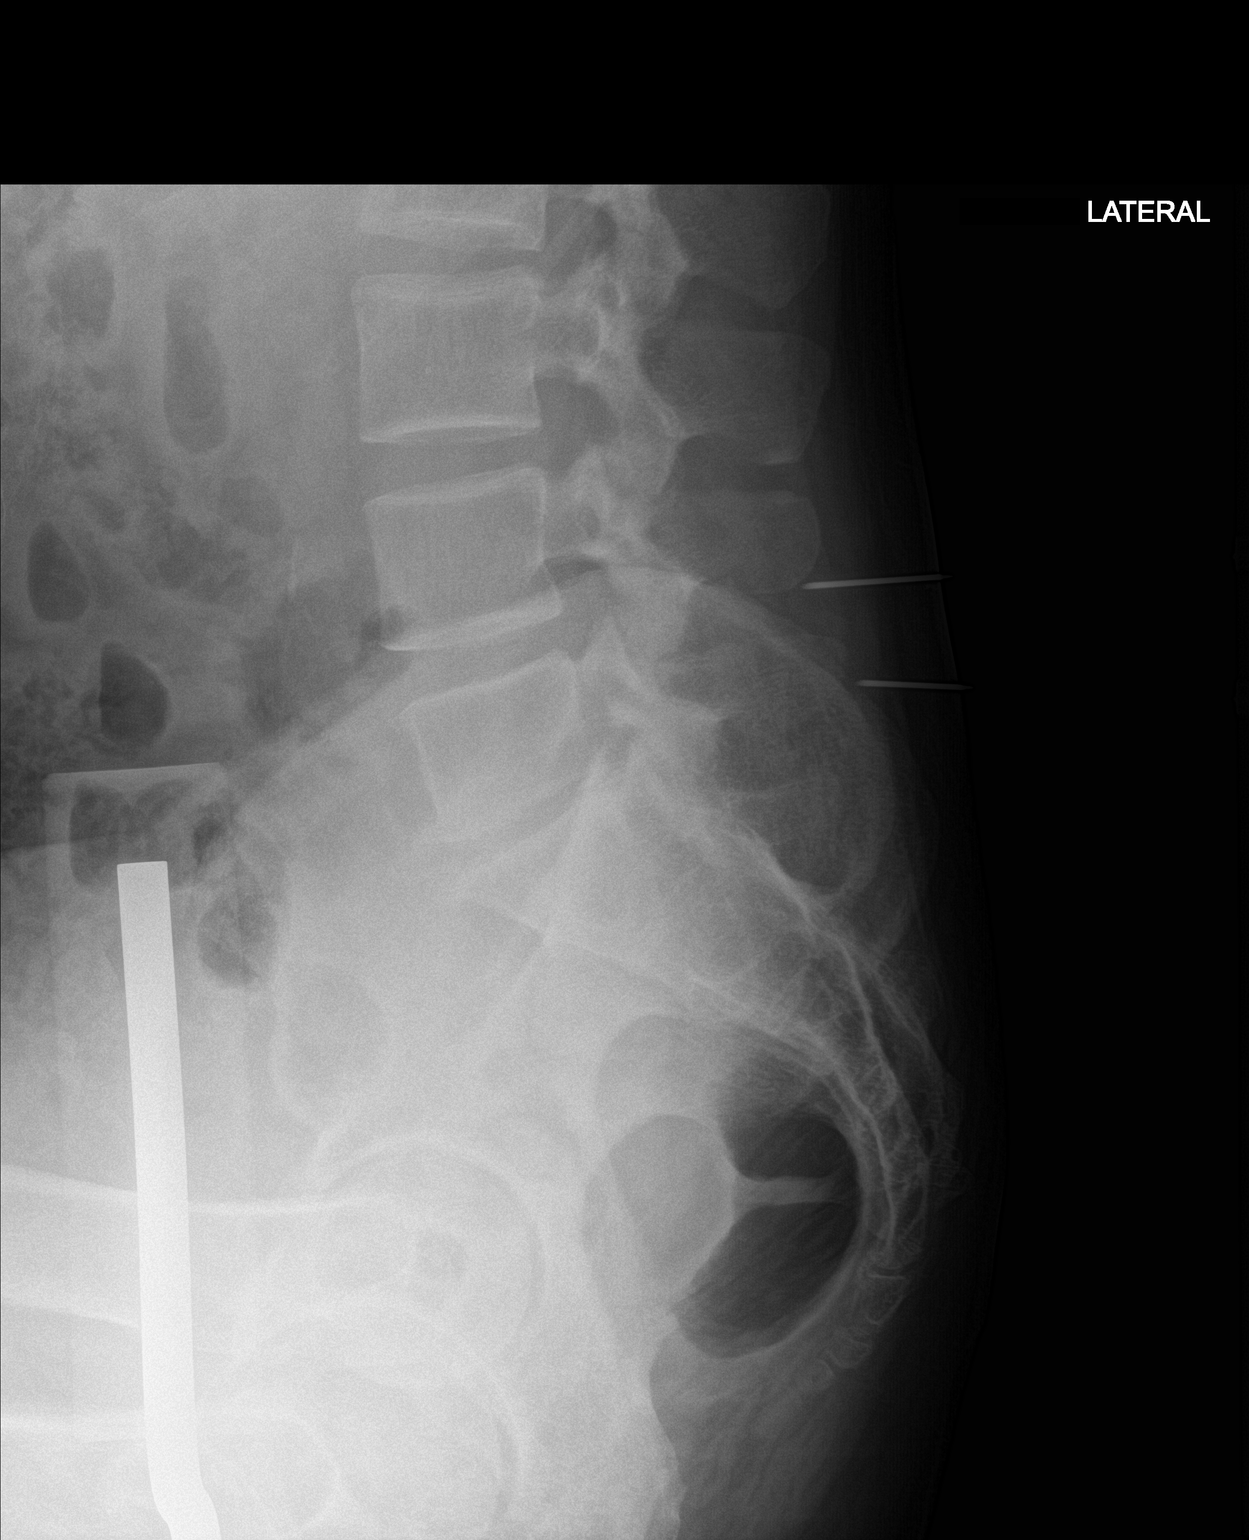

[1 of 1 positions shown; findings below may reference images not displayed]

FINDINGS: Image 1 demonstrates needle tips at L4-5 and L5-S1.
IMPRESSION: Needle tips at L4-5 and L5-S1.
